# Patient Record
Sex: Female | Born: 1935 | Race: White | Hispanic: No | Smoking: Never smoker
Health system: Southern US, Community
[De-identification: ages and names within clinical notes are randomized; demographics above are authoritative.]

## PROBLEM LIST (undated history)

## (undated) DIAGNOSIS — I10 Essential (primary) hypertension: Secondary | ICD-10-CM

## (undated) DIAGNOSIS — E119 Type 2 diabetes mellitus without complications: Secondary | ICD-10-CM

## (undated) HISTORY — PX: LUMBAR DISC SURGERY: SHX700

## (undated) HISTORY — PX: ABDOMINAL HYSTERECTOMY: SHX81

## (undated) HISTORY — DX: Type 2 diabetes mellitus without complications: E11.9

## (undated) HISTORY — DX: Essential (primary) hypertension: I10

## (undated) HISTORY — PX: APPENDECTOMY: SHX54

---

## 2006-06-22 ENCOUNTER — Ambulatory Visit (HOSPITAL_COMMUNITY): Admission: RE | Admit: 2006-06-22 | Discharge: 2006-06-22 | Payer: Self-pay | Admitting: Otolaryngology

## 2006-09-27 ENCOUNTER — Ambulatory Visit: Payer: Self-pay | Admitting: Family Medicine

## 2006-09-29 ENCOUNTER — Encounter (INDEPENDENT_AMBULATORY_CARE_PROVIDER_SITE_OTHER): Payer: Self-pay | Admitting: Family Medicine

## 2006-09-29 LAB — CONVERTED CEMR LAB
AST: 20 units/L (ref 0–37)
Albumin: 3.7 g/dL (ref 3.5–5.2)
Alkaline Phosphatase: 113 units/L (ref 39–117)
BUN: 13 mg/dL (ref 6–23)
Basophils Relative: 1 % (ref 0–1)
Calcium: 9.3 mg/dL (ref 8.4–10.5)
Chloride: 101 meq/L (ref 96–112)
HDL: 59 mg/dL (ref >39)
LDL Cholesterol: 122 mg/dL — ABNORMAL HIGH (ref 0–99)
Lymphs Abs: 2.5 10*3/uL (ref 0.7–3.3)
MCHC: 33.8 g/dL (ref 30.0–36.0)
Monocytes Relative: 6 % (ref 3–11)
Neutro Abs: 5.4 10*3/uL (ref 1.7–7.7)
Neutrophils Relative %: 62 % (ref 43–77)
Platelets: 247 10*3/uL (ref 150–400)
Potassium: 4.5 meq/L (ref 3.5–5.3)
RBC: 5.21 M/uL — ABNORMAL HIGH (ref 3.87–5.11)
Sodium: 142 meq/L (ref 135–145)
TSH: 0.732 microintl units/mL (ref 0.350–5.50)
Total Protein: 6.8 g/dL (ref 6.0–8.3)
WBC: 8.8 10*3/uL (ref 4.0–10.5)

## 2006-10-02 ENCOUNTER — Encounter: Payer: Self-pay | Admitting: Family Medicine

## 2006-10-02 DIAGNOSIS — M545 Low back pain: Secondary | ICD-10-CM

## 2006-10-02 DIAGNOSIS — H269 Unspecified cataract: Secondary | ICD-10-CM

## 2006-10-02 DIAGNOSIS — M1990 Unspecified osteoarthritis, unspecified site: Secondary | ICD-10-CM | POA: Insufficient documentation

## 2006-10-02 DIAGNOSIS — K5900 Constipation, unspecified: Secondary | ICD-10-CM | POA: Insufficient documentation

## 2006-10-02 DIAGNOSIS — N751 Abscess of Bartholin's gland: Secondary | ICD-10-CM

## 2006-10-11 ENCOUNTER — Telehealth (INDEPENDENT_AMBULATORY_CARE_PROVIDER_SITE_OTHER): Payer: Self-pay | Admitting: Family Medicine

## 2006-10-11 ENCOUNTER — Ambulatory Visit: Payer: Self-pay | Admitting: Family Medicine

## 2006-10-11 DIAGNOSIS — E1165 Type 2 diabetes mellitus with hyperglycemia: Secondary | ICD-10-CM

## 2006-10-11 DIAGNOSIS — E119 Type 2 diabetes mellitus without complications: Secondary | ICD-10-CM | POA: Insufficient documentation

## 2006-10-11 DIAGNOSIS — E8881 Metabolic syndrome: Secondary | ICD-10-CM

## 2006-10-11 DIAGNOSIS — D751 Secondary polycythemia: Secondary | ICD-10-CM | POA: Insufficient documentation

## 2006-10-11 DIAGNOSIS — E785 Hyperlipidemia, unspecified: Secondary | ICD-10-CM | POA: Insufficient documentation

## 2006-10-11 DIAGNOSIS — N1832 Chronic kidney disease, stage 3b: Secondary | ICD-10-CM | POA: Insufficient documentation

## 2006-10-11 DIAGNOSIS — I10 Essential (primary) hypertension: Secondary | ICD-10-CM | POA: Insufficient documentation

## 2006-10-11 DIAGNOSIS — K432 Incisional hernia without obstruction or gangrene: Secondary | ICD-10-CM | POA: Insufficient documentation

## 2006-10-11 LAB — CONVERTED CEMR LAB
Cholesterol, target level: 200 mg/dL
Glucose, Bld: 253 mg/dL
Hgb A1c MFr Bld: 12.8 %
LDL Goal: 100 mg/dL

## 2006-10-25 ENCOUNTER — Ambulatory Visit: Payer: Self-pay | Admitting: Family Medicine

## 2006-10-29 LAB — CONVERTED CEMR LAB
BUN: 16 mg/dL (ref 6–23)
Basophils Absolute: 0 10*3/uL (ref 0.0–0.1)
Basophils Relative: 1 % (ref 0–1)
Chloride: 101 meq/L (ref 96–112)
Eosinophils Absolute: 0.1 10*3/uL (ref 0.0–0.7)
MCHC: 33.3 g/dL (ref 30.0–36.0)
MCV: 91.3 fL (ref 78.0–100.0)
Neutro Abs: 3.2 10*3/uL (ref 1.7–7.7)
Neutrophils Relative %: 51 % (ref 43–77)
Platelets: 246 10*3/uL (ref 150–400)
Potassium: 4.4 meq/L (ref 3.5–5.3)
RBC: 5.27 M/uL — ABNORMAL HIGH (ref 3.87–5.11)
RDW: 12.2 % (ref 11.5–14.0)

## 2006-11-22 ENCOUNTER — Ambulatory Visit: Payer: Self-pay | Admitting: Family Medicine

## 2006-11-23 ENCOUNTER — Encounter (INDEPENDENT_AMBULATORY_CARE_PROVIDER_SITE_OTHER): Payer: Self-pay | Admitting: Family Medicine

## 2006-11-27 LAB — CONVERTED CEMR LAB
AST: 17 units/L (ref 0–37)
Albumin: 4 g/dL (ref 3.5–5.2)
Alkaline Phosphatase: 89 units/L (ref 39–117)
BUN: 14 mg/dL (ref 6–23)
Calcium: 9.7 mg/dL (ref 8.4–10.5)
Chloride: 103 meq/L (ref 96–112)
Glucose, Bld: 123 mg/dL — ABNORMAL HIGH (ref 70–99)
Potassium: 4.2 meq/L (ref 3.5–5.3)
Sodium: 139 meq/L (ref 135–145)
Total Protein: 7.2 g/dL (ref 6.0–8.3)

## 2007-01-03 ENCOUNTER — Ambulatory Visit: Payer: Self-pay | Admitting: Family Medicine

## 2007-01-03 DIAGNOSIS — R9431 Abnormal electrocardiogram [ECG] [EKG]: Secondary | ICD-10-CM

## 2007-01-04 ENCOUNTER — Encounter (INDEPENDENT_AMBULATORY_CARE_PROVIDER_SITE_OTHER): Payer: Self-pay | Admitting: Family Medicine

## 2007-01-04 ENCOUNTER — Ambulatory Visit: Payer: Self-pay | Admitting: Cardiology

## 2007-01-08 ENCOUNTER — Encounter (INDEPENDENT_AMBULATORY_CARE_PROVIDER_SITE_OTHER): Payer: Self-pay | Admitting: Family Medicine

## 2007-01-09 LAB — CONVERTED CEMR LAB
AST: 15 units/L (ref 0–37)
Alkaline Phosphatase: 89 units/L (ref 39–117)
BUN: 20 mg/dL (ref 6–23)
Glucose, Bld: 132 mg/dL — ABNORMAL HIGH (ref 70–99)
HDL: 56 mg/dL (ref >39)
LDL Cholesterol: 91 mg/dL (ref 0–99)
Potassium: 5 meq/L (ref 3.5–5.3)
Sodium: 141 meq/L (ref 135–145)
Total Bilirubin: 0.6 mg/dL (ref 0.3–1.2)
Triglycerides: 116 mg/dL (ref <150)
VLDL: 23 mg/dL (ref 0–40)

## 2007-01-16 ENCOUNTER — Ambulatory Visit: Payer: Self-pay | Admitting: Cardiology

## 2007-01-17 ENCOUNTER — Encounter (HOSPITAL_COMMUNITY): Admission: RE | Admit: 2007-01-17 | Discharge: 2007-02-16 | Payer: Self-pay | Admitting: Cardiology

## 2007-01-17 ENCOUNTER — Ambulatory Visit: Payer: Self-pay | Admitting: Cardiology

## 2007-01-22 ENCOUNTER — Emergency Department (HOSPITAL_COMMUNITY): Admission: EM | Admit: 2007-01-22 | Discharge: 2007-01-22 | Payer: Self-pay | Admitting: Emergency Medicine

## 2007-02-07 ENCOUNTER — Encounter (INDEPENDENT_AMBULATORY_CARE_PROVIDER_SITE_OTHER): Payer: Self-pay | Admitting: Family Medicine

## 2007-02-09 ENCOUNTER — Encounter (INDEPENDENT_AMBULATORY_CARE_PROVIDER_SITE_OTHER): Payer: Self-pay | Admitting: Family Medicine

## 2007-02-14 ENCOUNTER — Ambulatory Visit: Payer: Self-pay | Admitting: Family Medicine

## 2007-02-14 LAB — CONVERTED CEMR LAB: Glucose, Bld: 109 mg/dL

## 2007-05-23 ENCOUNTER — Ambulatory Visit: Payer: Self-pay | Admitting: Family Medicine

## 2007-05-23 LAB — CONVERTED CEMR LAB
Blood in Urine, dipstick: NEGATIVE
Glucose, Urine, Semiquant: NEGATIVE
Ketones, urine, test strip: NEGATIVE
Nitrite: NEGATIVE
Protein, U semiquant: NEGATIVE

## 2007-05-24 LAB — CONVERTED CEMR LAB
Creatinine, Urine: 73.1 mg/dL
Microalb Creat Ratio: 5.5 mg/g (ref 0.0–30.0)
Microalb, Ur: 0.4 mg/dL (ref 0.00–1.89)

## 2007-05-28 ENCOUNTER — Encounter (INDEPENDENT_AMBULATORY_CARE_PROVIDER_SITE_OTHER): Payer: Self-pay | Admitting: Family Medicine

## 2007-06-06 ENCOUNTER — Ambulatory Visit: Payer: Self-pay | Admitting: Family Medicine

## 2007-06-11 ENCOUNTER — Encounter (INDEPENDENT_AMBULATORY_CARE_PROVIDER_SITE_OTHER): Payer: Self-pay | Admitting: Family Medicine

## 2007-06-11 ENCOUNTER — Telehealth (INDEPENDENT_AMBULATORY_CARE_PROVIDER_SITE_OTHER): Payer: Self-pay | Admitting: *Deleted

## 2007-06-15 ENCOUNTER — Encounter (INDEPENDENT_AMBULATORY_CARE_PROVIDER_SITE_OTHER): Payer: Self-pay | Admitting: Family Medicine

## 2007-07-05 ENCOUNTER — Encounter (INDEPENDENT_AMBULATORY_CARE_PROVIDER_SITE_OTHER): Payer: Self-pay | Admitting: Family Medicine

## 2007-08-22 ENCOUNTER — Ambulatory Visit: Payer: Self-pay | Admitting: Family Medicine

## 2007-08-22 LAB — CONVERTED CEMR LAB
Glucose, Bld: 96 mg/dL
Hgb A1c MFr Bld: 6.7 %

## 2007-09-04 ENCOUNTER — Encounter (INDEPENDENT_AMBULATORY_CARE_PROVIDER_SITE_OTHER): Payer: Self-pay | Admitting: Family Medicine

## 2007-09-05 ENCOUNTER — Telehealth (INDEPENDENT_AMBULATORY_CARE_PROVIDER_SITE_OTHER): Payer: Self-pay | Admitting: *Deleted

## 2007-09-05 LAB — CONVERTED CEMR LAB
ALT: 13 units/L (ref 0–35)
Alkaline Phosphatase: 82 units/L (ref 39–117)
Basophils Absolute: 0.1 10*3/uL (ref 0.0–0.1)
CO2: 25 meq/L (ref 19–32)
Cholesterol: 192 mg/dL (ref 0–200)
Eosinophils Relative: 3 % (ref 0–5)
HCT: 44.7 % (ref 36.0–46.0)
LDL Cholesterol: 99 mg/dL (ref 0–99)
Lymphocytes Relative: 30 % (ref 12–46)
Neutrophils Relative %: 60 % (ref 43–77)
Platelets: 273 10*3/uL (ref 150–400)
Potassium: 4.5 meq/L (ref 3.5–5.3)
RDW: 12.7 % (ref 11.5–15.5)
Sodium: 141 meq/L (ref 135–145)
Total Bilirubin: 0.5 mg/dL (ref 0.3–1.2)
Total Protein: 6.9 g/dL (ref 6.0–8.3)
VLDL: 27 mg/dL (ref 0–40)

## 2007-10-01 ENCOUNTER — Encounter (INDEPENDENT_AMBULATORY_CARE_PROVIDER_SITE_OTHER): Payer: Self-pay | Admitting: Family Medicine

## 2007-10-03 ENCOUNTER — Ambulatory Visit: Payer: Self-pay | Admitting: Family Medicine

## 2007-11-02 ENCOUNTER — Encounter (INDEPENDENT_AMBULATORY_CARE_PROVIDER_SITE_OTHER): Payer: Self-pay | Admitting: Family Medicine

## 2008-01-02 ENCOUNTER — Ambulatory Visit: Payer: Self-pay | Admitting: Family Medicine

## 2008-01-02 DIAGNOSIS — J309 Allergic rhinitis, unspecified: Secondary | ICD-10-CM | POA: Insufficient documentation

## 2008-01-02 LAB — CONVERTED CEMR LAB: Glucose, Bld: 6.7 mg/dL

## 2008-01-16 ENCOUNTER — Encounter (INDEPENDENT_AMBULATORY_CARE_PROVIDER_SITE_OTHER): Payer: Self-pay | Admitting: Family Medicine

## 2008-01-17 ENCOUNTER — Telehealth (INDEPENDENT_AMBULATORY_CARE_PROVIDER_SITE_OTHER): Payer: Self-pay | Admitting: *Deleted

## 2008-01-17 LAB — CONVERTED CEMR LAB
Albumin: 4.5 g/dL (ref 3.5–5.2)
CO2: 27 meq/L (ref 19–32)
Cholesterol: 194 mg/dL (ref 0–200)
Glucose, Bld: 135 mg/dL — ABNORMAL HIGH (ref 70–99)
LDL Cholesterol: 106 mg/dL — ABNORMAL HIGH (ref 0–99)
Lymphocytes Relative: 26 % (ref 12–46)
Lymphs Abs: 2 10*3/uL (ref 0.7–4.0)
Neutrophils Relative %: 64 % (ref 43–77)
Platelets: 262 10*3/uL (ref 150–400)
Potassium: 4.3 meq/L (ref 3.5–5.3)
Sodium: 144 meq/L (ref 135–145)
Total Protein: 7.1 g/dL (ref 6.0–8.3)
Triglycerides: 105 mg/dL (ref <150)
WBC: 7.7 10*3/uL (ref 4.0–10.5)

## 2008-03-04 ENCOUNTER — Encounter (INDEPENDENT_AMBULATORY_CARE_PROVIDER_SITE_OTHER): Payer: Self-pay | Admitting: Family Medicine

## 2008-04-02 ENCOUNTER — Ambulatory Visit: Payer: Self-pay | Admitting: Family Medicine

## 2008-04-02 DIAGNOSIS — IMO0001 Reserved for inherently not codable concepts without codable children: Secondary | ICD-10-CM

## 2008-04-03 LAB — CONVERTED CEMR LAB
ALT: 15 units/L (ref 0–35)
Albumin: 4.2 g/dL (ref 3.5–5.2)
Alkaline Phosphatase: 81 units/L (ref 39–117)
CO2: 20 meq/L (ref 19–32)
Glucose, Bld: 105 mg/dL — ABNORMAL HIGH (ref 70–99)
Potassium: 4.2 meq/L (ref 3.5–5.3)
Sodium: 138 meq/L (ref 135–145)
Total Protein: 7.2 g/dL (ref 6.0–8.3)

## 2008-04-25 ENCOUNTER — Ambulatory Visit: Payer: Self-pay | Admitting: Family Medicine

## 2008-06-06 ENCOUNTER — Ambulatory Visit: Payer: Self-pay | Admitting: Family Medicine

## 2008-06-09 LAB — CONVERTED CEMR LAB
ALT: 15 units/L (ref 0–35)
AST: 16 units/L (ref 0–37)
Albumin: 4 g/dL (ref 3.5–5.2)
Total Bilirubin: 0.5 mg/dL (ref 0.3–1.2)
Total Protein: 6.7 g/dL (ref 6.0–8.3)

## 2008-07-18 ENCOUNTER — Ambulatory Visit: Payer: Self-pay | Admitting: Family Medicine

## 2008-07-18 DIAGNOSIS — E669 Obesity, unspecified: Secondary | ICD-10-CM

## 2008-07-18 LAB — CONVERTED CEMR LAB: Blood Glucose, Fasting: 146 mg/dL

## 2008-07-21 LAB — CONVERTED CEMR LAB
ALT: 14 units/L (ref 0–35)
AST: 14 units/L (ref 0–37)
BUN: 17 mg/dL (ref 6–23)
Basophils Absolute: 0.1 10*3/uL (ref 0.0–0.1)
Basophils Relative: 1 % (ref 0–1)
Creatinine, Ser: 0.59 mg/dL (ref 0.40–1.20)
Creatinine, Urine: 141.8 mg/dL
Eosinophils Absolute: 0.2 10*3/uL (ref 0.0–0.7)
Eosinophils Relative: 3 % (ref 0–5)
HCT: 43.2 % (ref 36.0–46.0)
HDL: 65 mg/dL (ref >39)
Hemoglobin: 14.2 g/dL (ref 12.0–15.0)
MCHC: 32.9 g/dL (ref 30.0–36.0)
MCV: 90.8 fL (ref 78.0–100.0)
Monocytes Absolute: 0.7 10*3/uL (ref 0.1–1.0)
RDW: 12.9 % (ref 11.5–15.5)
TSH: 0.792 microintl units/mL (ref 0.350–4.50)
Total Bilirubin: 0.5 mg/dL (ref 0.3–1.2)
Total CHOL/HDL Ratio: 2.4
VLDL: 27 mg/dL (ref 0–40)

## 2008-10-17 ENCOUNTER — Ambulatory Visit: Payer: Self-pay | Admitting: Family Medicine

## 2008-10-17 LAB — CONVERTED CEMR LAB: Glucose, Bld: 135 mg/dL

## 2009-03-06 ENCOUNTER — Ambulatory Visit: Payer: Self-pay | Admitting: Family Medicine

## 2009-03-06 DIAGNOSIS — E119 Type 2 diabetes mellitus without complications: Secondary | ICD-10-CM

## 2009-03-06 LAB — CONVERTED CEMR LAB: Glucose, Bld: 142 mg/dL

## 2009-03-23 ENCOUNTER — Encounter (INDEPENDENT_AMBULATORY_CARE_PROVIDER_SITE_OTHER): Payer: Self-pay | Admitting: Family Medicine

## 2009-03-25 ENCOUNTER — Encounter (INDEPENDENT_AMBULATORY_CARE_PROVIDER_SITE_OTHER): Payer: Self-pay | Admitting: *Deleted

## 2009-03-25 LAB — CONVERTED CEMR LAB
ALT: 16 units/L (ref 0–35)
AST: 16 units/L (ref 0–37)
Albumin: 3.9 g/dL (ref 3.5–5.2)
BUN: 16 mg/dL (ref 6–23)
CO2: 26 meq/L (ref 19–32)
Calcium: 9.5 mg/dL (ref 8.4–10.5)
Chloride: 105 meq/L (ref 96–112)
Cholesterol: 188 mg/dL (ref 0–200)
Creatinine, Ser: 0.78 mg/dL (ref 0.40–1.20)
Potassium: 4.5 meq/L (ref 3.5–5.3)
Total CHOL/HDL Ratio: 3.2

## 2009-05-15 ENCOUNTER — Encounter (INDEPENDENT_AMBULATORY_CARE_PROVIDER_SITE_OTHER): Payer: Self-pay | Admitting: Family Medicine

## 2010-07-28 ENCOUNTER — Ambulatory Visit: Payer: Self-pay | Admitting: Cardiology

## 2010-07-28 ENCOUNTER — Observation Stay (HOSPITAL_COMMUNITY): Admission: EM | Admit: 2010-07-28 | Discharge: 2010-07-29 | Payer: Self-pay | Admitting: Emergency Medicine

## 2010-07-28 ENCOUNTER — Encounter (INDEPENDENT_AMBULATORY_CARE_PROVIDER_SITE_OTHER): Payer: Self-pay | Admitting: Internal Medicine

## 2010-09-25 ENCOUNTER — Encounter: Payer: Self-pay | Admitting: Otolaryngology

## 2010-09-25 ENCOUNTER — Encounter: Payer: Self-pay | Admitting: Internal Medicine

## 2010-11-16 LAB — URINALYSIS, ROUTINE W REFLEX MICROSCOPIC
Glucose, UA: NEGATIVE mg/dL
Hgb urine dipstick: NEGATIVE
Protein, ur: NEGATIVE mg/dL
pH: 6.5 (ref 5.0–8.0)

## 2010-11-16 LAB — COMPREHENSIVE METABOLIC PANEL
ALT: 16 U/L (ref 0–35)
Alkaline Phosphatase: 83 U/L (ref 39–117)
BUN: 14 mg/dL (ref 6–23)
CO2: 27 mEq/L (ref 19–32)
Chloride: 104 mEq/L (ref 96–112)
Glucose, Bld: 169 mg/dL — ABNORMAL HIGH (ref 70–99)
Potassium: 4 mEq/L (ref 3.5–5.1)
Sodium: 139 mEq/L (ref 135–145)
Total Bilirubin: 0.4 mg/dL (ref 0.3–1.2)
Total Protein: 6.5 g/dL (ref 6.0–8.3)

## 2010-11-16 LAB — URINE CULTURE

## 2010-11-16 LAB — DIFFERENTIAL
Basophils Absolute: 0.1 10*3/uL (ref 0.0–0.1)
Basophils Relative: 0 % (ref 0–1)
Basophils Relative: 1 % (ref 0–1)
Eosinophils Absolute: 0.1 10*3/uL (ref 0.0–0.7)
Lymphs Abs: 2 10*3/uL (ref 0.7–4.0)
Monocytes Relative: 5 % (ref 3–12)
Monocytes Relative: 6 % (ref 3–12)
Neutro Abs: 5.7 10*3/uL (ref 1.7–7.7)
Neutro Abs: 7.8 10*3/uL — ABNORMAL HIGH (ref 1.7–7.7)
Neutrophils Relative %: 69 % (ref 43–77)
Neutrophils Relative %: 81 % — ABNORMAL HIGH (ref 43–77)

## 2010-11-16 LAB — CBC
HCT: 37.3 % (ref 36.0–46.0)
Hemoglobin: 13 g/dL (ref 12.0–15.0)
Hemoglobin: 13.1 g/dL (ref 12.0–15.0)
MCHC: 34 g/dL (ref 30.0–36.0)
MCV: 85.4 fL (ref 78.0–100.0)
RBC: 4.37 MIL/uL (ref 3.87–5.11)
RBC: 4.53 MIL/uL (ref 3.87–5.11)
RDW: 13.2 % (ref 11.5–15.5)
WBC: 8.4 10*3/uL (ref 4.0–10.5)
WBC: 9.6 10*3/uL (ref 4.0–10.5)

## 2010-11-16 LAB — BASIC METABOLIC PANEL
CO2: 27 mEq/L (ref 19–32)
Glucose, Bld: 146 mg/dL — ABNORMAL HIGH (ref 70–99)
Potassium: 4.1 mEq/L (ref 3.5–5.1)
Sodium: 143 mEq/L (ref 135–145)

## 2010-11-16 LAB — POCT CARDIAC MARKERS: CKMB, poc: 1.5 ng/mL (ref 1.0–8.0)

## 2010-11-16 LAB — CARDIAC PANEL(CRET KIN+CKTOT+MB+TROPI)
CK, MB: 2.4 ng/mL (ref 0.3–4.0)
CK, MB: 3.4 ng/mL (ref 0.3–4.0)
Total CK: 89 U/L (ref 7–177)
Troponin I: 0.01 ng/mL (ref 0.00–0.06)

## 2010-11-16 LAB — TROPONIN I: Troponin I: 0.01 ng/mL (ref 0.00–0.06)

## 2010-11-16 LAB — GLUCOSE, CAPILLARY
Glucose-Capillary: 149 mg/dL — ABNORMAL HIGH (ref 70–99)
Glucose-Capillary: 166 mg/dL — ABNORMAL HIGH (ref 70–99)

## 2010-11-16 LAB — CK TOTAL AND CKMB (NOT AT ARMC): CK, MB: 2.7 ng/mL (ref 0.3–4.0)

## 2011-01-18 NOTE — Letter (Signed)
Jan 04, 2007    Weston Settle, M.D.  [address], [place]  [place], [place]   RE:  Whitney Watts, Whitney Watts  MRN:  [id number]  /  DOB:  1923/08/30   Dear Roderic Scarce,   Whitney Watts was evaluated in the office today in consultation, at your  request, for chest discomfort with multiple cardiovascular risk factors  and EKG abnormalities.  As you know, this nice woman has spent all her  life [place] and has been entirely healthy, but has received little in  the way of medical attention.  She recently sought evaluation in your  office for general medical care, and was found to have hypertension,  dyslipidemia and diabetes.  She has apparently done well with initial  medical therapy.  She was also noted to have EKG abnormalities.  On a  followup visit, she reported chest fullness whenever she took  amlodipine.  The dosage was decreased, but she continues to have some of  this vague discomfort.  There does not appear to be associated dyspnea,  diaphoresis nor nausea.  She has no problems with exertion.  She has  never previously been evaluated by a cardiologist nor undergone any  significant cardiac testing.   CURRENT MEDICATIONS INCLUDE:  1. Amlodipine 5 mg daily.  2. Pravastatin 20 mg daily.  3. Lisinopril/HCTZ 20/25 mg daily.  4. Aspirin 81 mg daily.  5. Metformin 500 mg b.i.d.   ALLERGIES:  She has no known drug allergies.  She does describe an  allergy to BEE STINGS.   PAST MEDICAL HISTORY:  Otherwise notable for a remote appendectomy,  hysterectomy approximately 1970, and lumbar spine surgery in 1970.  She  has had cataracts excised from both eyes.  Vaccinations are up to date.   SOCIAL HISTORY:  Married and lives locally.  Employed in the home.  Does  a considerable amount of walking.  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  Has two living adult children and one who died  due to neoplastic disease.   FAMILY HISTORY:  Otherwise notable  for her father dying at age 58, due  to renal cell carcinoma, and her mother at age 26, due to CHF.  She has  no first degree relatives with coronary disease.   REVIEW OF SYSTEMS:  Positive for occasional headaches since she started  her current medications, the need for corrective lenses, occasional  constipation, some DJD of her fingers.  All other systems reviewed and  are negative.   ON EXAM:  A very pleasant, sharp, older woman.  The weight is 187, blood  pressure 135/75, heart rate 90 and regular, respirations 16.  HEENT:  Grade 1 hypertensive changes on funduscopic exam.  NECK:  No jugular venous distention, normal carotid upstrokes, without  bruits.  ENDOCRINE:  No thyromegaly.  HEMATOPOIETIC:  No adenopathy.  SKIN:  No significant lesions.  CARDIAC:  Normal first and second heart sounds.  Split first heart  sound, versus fourth heart sound.  Minimal systolic murmur.  Normal PMI.  ABDOMEN:  Umbilical hernia.  Normal bowel sounds.  Soft and nontender  without organomegaly or masses.  EXTREMITIES:  One-half-plus ankle edema; distal pulses intact.  NEUROMUSCULAR:  Symmetric strength and tone.  Normal cranial nerves.  MUSCULOSKELETAL:  No joint deformities.   EKG:  Normal sinus rhythm, left anterior fascicular block, left atrial  enlargement, LVH with repolarization abnormality, probable prior septal  myocardial infarction.   IMPRESSION:  Despite  an absence of significant symptoms, Whitney Watts has  been found to have a number of medical issues.  Her EKG abnormalities  could all reflect LVH, but it is incumbent that we rule out prior  myocardial infarction.  With atypical chest pain, I would favor a stress  nuclear study over echocardiography.  We will schedule that test and  report the results to you as soon as it has been completed.   Otherwise, medical therapy appears optimal.  Hypertension is adequately  controlled.  She requires lipid-lowering therapy, due to the presence  of  diabetes and possible vascular disease.  Daily aspirin is prudent.   Thanks so much for sending this very nice woman to me.  I will keep you  apprised as her evaluation proceeds.    Sincerely,      Cristopher Estimable. Lattie Haw, MD, [facility]  Electronically Signed    RMR/MedQ  DD: 01/04/2007  DT: 01/04/2007  Job #: [id number]

## 2013-04-17 ENCOUNTER — Encounter: Payer: Self-pay | Admitting: Orthopedic Surgery

## 2013-04-17 ENCOUNTER — Ambulatory Visit (INDEPENDENT_AMBULATORY_CARE_PROVIDER_SITE_OTHER): Payer: Medicare Other

## 2013-04-17 ENCOUNTER — Ambulatory Visit (INDEPENDENT_AMBULATORY_CARE_PROVIDER_SITE_OTHER): Payer: Medicare Other | Admitting: Orthopedic Surgery

## 2013-04-17 VITALS — BP 138/80 | Ht 65.0 in | Wt 194.0 lb

## 2013-04-17 DIAGNOSIS — M25569 Pain in unspecified knee: Secondary | ICD-10-CM

## 2013-04-17 DIAGNOSIS — M179 Osteoarthritis of knee, unspecified: Secondary | ICD-10-CM

## 2013-04-17 DIAGNOSIS — M1710 Unilateral primary osteoarthritis, unspecified knee: Secondary | ICD-10-CM

## 2013-04-17 DIAGNOSIS — M25561 Pain in right knee: Secondary | ICD-10-CM

## 2013-04-17 NOTE — Progress Notes (Signed)
  Subjective:    Whitney Watts is a 79 y.o. female referred to [place] by Dr. Wende Neighbors with a 15 year history of pain in her right knee she's received multiple cortisone injections she basically has a typical arthritic symptoms of lateral knee pain aching throbbing occasional giving out symptoms stiffness swelling no recent trauma   The following portions of the patient's history were reviewed and updated as appropriate: allergies, current medications, past family history, past medical history, past social history, past surgical history and problem list.   Review of Systems Please review the scans documents this has been reviewed today by the physician.   Objective:    BP 138/80  Ht 5\' 5"  (1.651 m)  Wt 194 lb (87.998 kg)  BMI 32.28 kg/m2 General appearance is normal, the patient is alert and oriented x3 with normal mood and affect. She's walking with a cane doesn't have a significant limp her upper extremities show no alignment abnormalities no contracture subluxation atrophy or tremor  Is tenderness over the lateral joint line of the right knee she has maintained 125 of knee flexion her knee is stable but in valgus position strength is normal in terms of extension flexion. Scans intact pulses are good sensation is normal   X-ray shows valgus arthritis Assessment:    Right Osteoarthritis with valgus alignment    Plan:    She does not want a replacement although she looks to be in good enough shape to have one even at 79 years old. She wants an injection   Knee  Injection Procedure Note  Pre-operative Diagnosis: right knee oa  Post-operative Diagnosis: same  Indications: pain  Anesthesia: ethyl chloride   Procedure Details   Verbal consent was obtained for the procedure. Time out was completed.The joint was prepped with alcohol, followed by  Ethyl chloride spray and A 20 gauge needle was inserted into the knee via lateral approach; 60ml 1% lidocaine and 1 ml of depomedrol  was  then injected into the joint . The needle was removed and the area cleansed and dressed.  Complications:  None; patient tolerated the procedure well.

## 2013-04-17 NOTE — Patient Instructions (Addendum)

## 2016-12-16 DIAGNOSIS — E1165 Type 2 diabetes mellitus with hyperglycemia: Secondary | ICD-10-CM | POA: Diagnosis not present

## 2016-12-16 DIAGNOSIS — E782 Mixed hyperlipidemia: Secondary | ICD-10-CM | POA: Diagnosis not present

## 2016-12-23 DIAGNOSIS — Z6827 Body mass index (BMI) 27.0-27.9, adult: Secondary | ICD-10-CM | POA: Diagnosis not present

## 2016-12-23 DIAGNOSIS — E782 Mixed hyperlipidemia: Secondary | ICD-10-CM | POA: Diagnosis not present

## 2016-12-23 DIAGNOSIS — I10 Essential (primary) hypertension: Secondary | ICD-10-CM | POA: Diagnosis not present

## 2016-12-23 DIAGNOSIS — Z0000 Encounter for general adult medical examination without abnormal findings: Secondary | ICD-10-CM | POA: Diagnosis not present

## 2016-12-23 DIAGNOSIS — E1165 Type 2 diabetes mellitus with hyperglycemia: Secondary | ICD-10-CM | POA: Diagnosis not present

## 2017-07-11 DIAGNOSIS — E782 Mixed hyperlipidemia: Secondary | ICD-10-CM | POA: Diagnosis not present

## 2017-07-11 DIAGNOSIS — E1165 Type 2 diabetes mellitus with hyperglycemia: Secondary | ICD-10-CM | POA: Diagnosis not present

## 2017-07-11 DIAGNOSIS — I10 Essential (primary) hypertension: Secondary | ICD-10-CM | POA: Diagnosis not present

## 2017-07-13 DIAGNOSIS — E782 Mixed hyperlipidemia: Secondary | ICD-10-CM | POA: Diagnosis not present

## 2017-07-13 DIAGNOSIS — M25562 Pain in left knee: Secondary | ICD-10-CM | POA: Diagnosis not present

## 2017-07-13 DIAGNOSIS — R296 Repeated falls: Secondary | ICD-10-CM | POA: Diagnosis not present

## 2017-07-13 DIAGNOSIS — E1165 Type 2 diabetes mellitus with hyperglycemia: Secondary | ICD-10-CM | POA: Diagnosis not present

## 2017-07-13 DIAGNOSIS — Z23 Encounter for immunization: Secondary | ICD-10-CM | POA: Diagnosis not present

## 2017-07-13 DIAGNOSIS — I10 Essential (primary) hypertension: Secondary | ICD-10-CM | POA: Diagnosis not present

## 2017-08-21 ENCOUNTER — Other Ambulatory Visit: Payer: Self-pay | Admitting: Pharmacy Technician

## 2017-08-21 NOTE — Patient Outreach (Signed)
[facility]) Care Management  08/21/2017  Whitney Watts [birth year] [id number]  Incoming [employer] EMMI call in reference to medication adherence. HIPAA identifiers verified and verbal consent received. Patient states she is only on 3 medication's and she takes them daily as prescribed. She does not currently have any barriers that would affect her adherence.  Doreene Burke, [place] [phone]

## 2018-01-24 DIAGNOSIS — I10 Essential (primary) hypertension: Secondary | ICD-10-CM | POA: Diagnosis not present

## 2018-01-24 DIAGNOSIS — E782 Mixed hyperlipidemia: Secondary | ICD-10-CM | POA: Diagnosis not present

## 2018-01-26 DIAGNOSIS — Z6828 Body mass index (BMI) 28.0-28.9, adult: Secondary | ICD-10-CM | POA: Diagnosis not present

## 2018-01-26 DIAGNOSIS — E782 Mixed hyperlipidemia: Secondary | ICD-10-CM | POA: Diagnosis not present

## 2018-01-26 DIAGNOSIS — M25561 Pain in right knee: Secondary | ICD-10-CM | POA: Diagnosis not present

## 2018-01-26 DIAGNOSIS — E1165 Type 2 diabetes mellitus with hyperglycemia: Secondary | ICD-10-CM | POA: Diagnosis not present

## 2018-01-26 DIAGNOSIS — M545 Low back pain: Secondary | ICD-10-CM | POA: Diagnosis not present

## 2018-01-26 DIAGNOSIS — I10 Essential (primary) hypertension: Secondary | ICD-10-CM | POA: Diagnosis not present

## 2018-07-27 DIAGNOSIS — R002 Palpitations: Secondary | ICD-10-CM | POA: Diagnosis not present

## 2018-07-27 DIAGNOSIS — E1165 Type 2 diabetes mellitus with hyperglycemia: Secondary | ICD-10-CM | POA: Diagnosis not present

## 2018-07-27 DIAGNOSIS — I10 Essential (primary) hypertension: Secondary | ICD-10-CM | POA: Diagnosis not present

## 2018-07-27 DIAGNOSIS — E782 Mixed hyperlipidemia: Secondary | ICD-10-CM | POA: Diagnosis not present

## 2018-07-31 DIAGNOSIS — Z9181 History of falling: Secondary | ICD-10-CM | POA: Diagnosis not present

## 2018-07-31 DIAGNOSIS — R296 Repeated falls: Secondary | ICD-10-CM | POA: Diagnosis not present

## 2018-07-31 DIAGNOSIS — E1165 Type 2 diabetes mellitus with hyperglycemia: Secondary | ICD-10-CM | POA: Diagnosis not present

## 2018-07-31 DIAGNOSIS — Z974 Presence of external hearing-aid: Secondary | ICD-10-CM | POA: Diagnosis not present

## 2018-07-31 DIAGNOSIS — E782 Mixed hyperlipidemia: Secondary | ICD-10-CM | POA: Diagnosis not present

## 2018-07-31 DIAGNOSIS — Z0000 Encounter for general adult medical examination without abnormal findings: Secondary | ICD-10-CM | POA: Diagnosis not present

## 2018-07-31 DIAGNOSIS — M1990 Unspecified osteoarthritis, unspecified site: Secondary | ICD-10-CM | POA: Diagnosis not present

## 2018-07-31 DIAGNOSIS — I10 Essential (primary) hypertension: Secondary | ICD-10-CM | POA: Diagnosis not present

## 2018-07-31 DIAGNOSIS — M25562 Pain in left knee: Secondary | ICD-10-CM | POA: Diagnosis not present

## 2019-01-21 DIAGNOSIS — Z0000 Encounter for general adult medical examination without abnormal findings: Secondary | ICD-10-CM | POA: Diagnosis not present

## 2019-01-31 DIAGNOSIS — R002 Palpitations: Secondary | ICD-10-CM | POA: Diagnosis not present

## 2019-01-31 DIAGNOSIS — E782 Mixed hyperlipidemia: Secondary | ICD-10-CM | POA: Diagnosis not present

## 2019-01-31 DIAGNOSIS — E1165 Type 2 diabetes mellitus with hyperglycemia: Secondary | ICD-10-CM | POA: Diagnosis not present

## 2019-01-31 DIAGNOSIS — I10 Essential (primary) hypertension: Secondary | ICD-10-CM | POA: Diagnosis not present

## 2019-02-04 DIAGNOSIS — E1165 Type 2 diabetes mellitus with hyperglycemia: Secondary | ICD-10-CM | POA: Diagnosis not present

## 2019-02-04 DIAGNOSIS — R002 Palpitations: Secondary | ICD-10-CM | POA: Diagnosis not present

## 2019-02-04 DIAGNOSIS — E782 Mixed hyperlipidemia: Secondary | ICD-10-CM | POA: Diagnosis not present

## 2019-02-04 DIAGNOSIS — I10 Essential (primary) hypertension: Secondary | ICD-10-CM | POA: Diagnosis not present

## 2019-03-07 DIAGNOSIS — I10 Essential (primary) hypertension: Secondary | ICD-10-CM | POA: Diagnosis not present

## 2019-03-07 DIAGNOSIS — E1165 Type 2 diabetes mellitus with hyperglycemia: Secondary | ICD-10-CM | POA: Diagnosis not present

## 2019-03-07 DIAGNOSIS — E782 Mixed hyperlipidemia: Secondary | ICD-10-CM | POA: Diagnosis not present

## 2019-03-07 DIAGNOSIS — R002 Palpitations: Secondary | ICD-10-CM | POA: Diagnosis not present

## 2019-04-08 ENCOUNTER — Other Ambulatory Visit: Payer: Self-pay

## 2019-04-18 DIAGNOSIS — E782 Mixed hyperlipidemia: Secondary | ICD-10-CM | POA: Diagnosis not present

## 2019-04-18 DIAGNOSIS — I10 Essential (primary) hypertension: Secondary | ICD-10-CM | POA: Diagnosis not present

## 2019-04-18 DIAGNOSIS — R002 Palpitations: Secondary | ICD-10-CM | POA: Diagnosis not present

## 2019-04-18 DIAGNOSIS — E1165 Type 2 diabetes mellitus with hyperglycemia: Secondary | ICD-10-CM | POA: Diagnosis not present

## 2019-05-17 DIAGNOSIS — I10 Essential (primary) hypertension: Secondary | ICD-10-CM | POA: Diagnosis not present

## 2019-05-17 DIAGNOSIS — E782 Mixed hyperlipidemia: Secondary | ICD-10-CM | POA: Diagnosis not present

## 2019-05-17 DIAGNOSIS — E1165 Type 2 diabetes mellitus with hyperglycemia: Secondary | ICD-10-CM | POA: Diagnosis not present

## 2019-05-21 DIAGNOSIS — E782 Mixed hyperlipidemia: Secondary | ICD-10-CM | POA: Diagnosis not present

## 2019-05-21 DIAGNOSIS — R002 Palpitations: Secondary | ICD-10-CM | POA: Diagnosis not present

## 2019-05-21 DIAGNOSIS — E1165 Type 2 diabetes mellitus with hyperglycemia: Secondary | ICD-10-CM | POA: Diagnosis not present

## 2019-05-21 DIAGNOSIS — I10 Essential (primary) hypertension: Secondary | ICD-10-CM | POA: Diagnosis not present

## 2019-05-22 DIAGNOSIS — E1165 Type 2 diabetes mellitus with hyperglycemia: Secondary | ICD-10-CM | POA: Diagnosis not present

## 2019-05-22 DIAGNOSIS — R296 Repeated falls: Secondary | ICD-10-CM | POA: Diagnosis not present

## 2019-05-22 DIAGNOSIS — M479 Spondylosis, unspecified: Secondary | ICD-10-CM | POA: Diagnosis not present

## 2019-05-22 DIAGNOSIS — I10 Essential (primary) hypertension: Secondary | ICD-10-CM | POA: Diagnosis not present

## 2019-05-22 DIAGNOSIS — M25561 Pain in right knee: Secondary | ICD-10-CM | POA: Diagnosis not present

## 2019-05-22 DIAGNOSIS — E782 Mixed hyperlipidemia: Secondary | ICD-10-CM | POA: Diagnosis not present

## 2019-05-22 DIAGNOSIS — Z9181 History of falling: Secondary | ICD-10-CM | POA: Diagnosis not present

## 2019-05-22 DIAGNOSIS — M1711 Unilateral primary osteoarthritis, right knee: Secondary | ICD-10-CM | POA: Diagnosis not present

## 2019-06-20 DIAGNOSIS — E1165 Type 2 diabetes mellitus with hyperglycemia: Secondary | ICD-10-CM | POA: Diagnosis not present

## 2019-06-20 DIAGNOSIS — I10 Essential (primary) hypertension: Secondary | ICD-10-CM | POA: Diagnosis not present

## 2019-06-20 DIAGNOSIS — E782 Mixed hyperlipidemia: Secondary | ICD-10-CM | POA: Diagnosis not present

## 2019-06-20 DIAGNOSIS — M479 Spondylosis, unspecified: Secondary | ICD-10-CM | POA: Diagnosis not present

## 2019-06-20 DIAGNOSIS — M1711 Unilateral primary osteoarthritis, right knee: Secondary | ICD-10-CM | POA: Diagnosis not present

## 2019-07-26 DIAGNOSIS — R002 Palpitations: Secondary | ICD-10-CM | POA: Diagnosis not present

## 2019-07-26 DIAGNOSIS — E782 Mixed hyperlipidemia: Secondary | ICD-10-CM | POA: Diagnosis not present

## 2019-07-26 DIAGNOSIS — E1165 Type 2 diabetes mellitus with hyperglycemia: Secondary | ICD-10-CM | POA: Diagnosis not present

## 2019-07-26 DIAGNOSIS — I10 Essential (primary) hypertension: Secondary | ICD-10-CM | POA: Diagnosis not present

## 2019-08-22 DIAGNOSIS — R002 Palpitations: Secondary | ICD-10-CM | POA: Diagnosis not present

## 2019-08-22 DIAGNOSIS — I10 Essential (primary) hypertension: Secondary | ICD-10-CM | POA: Diagnosis not present

## 2019-08-22 DIAGNOSIS — E1165 Type 2 diabetes mellitus with hyperglycemia: Secondary | ICD-10-CM | POA: Diagnosis not present

## 2019-08-22 DIAGNOSIS — E782 Mixed hyperlipidemia: Secondary | ICD-10-CM | POA: Diagnosis not present

## 2019-09-25 DIAGNOSIS — I10 Essential (primary) hypertension: Secondary | ICD-10-CM | POA: Diagnosis not present

## 2019-09-25 DIAGNOSIS — E1165 Type 2 diabetes mellitus with hyperglycemia: Secondary | ICD-10-CM | POA: Diagnosis not present

## 2019-09-25 DIAGNOSIS — E7849 Other hyperlipidemia: Secondary | ICD-10-CM | POA: Diagnosis not present

## 2019-09-25 DIAGNOSIS — R002 Palpitations: Secondary | ICD-10-CM | POA: Diagnosis not present

## 2019-10-24 DIAGNOSIS — I10 Essential (primary) hypertension: Secondary | ICD-10-CM | POA: Diagnosis not present

## 2019-10-24 DIAGNOSIS — E1165 Type 2 diabetes mellitus with hyperglycemia: Secondary | ICD-10-CM | POA: Diagnosis not present

## 2019-10-24 DIAGNOSIS — E7849 Other hyperlipidemia: Secondary | ICD-10-CM | POA: Diagnosis not present

## 2019-10-24 DIAGNOSIS — R002 Palpitations: Secondary | ICD-10-CM | POA: Diagnosis not present

## 2019-10-24 DIAGNOSIS — E782 Mixed hyperlipidemia: Secondary | ICD-10-CM | POA: Diagnosis not present

## 2020-01-15 DIAGNOSIS — R002 Palpitations: Secondary | ICD-10-CM | POA: Diagnosis not present

## 2020-01-15 DIAGNOSIS — I10 Essential (primary) hypertension: Secondary | ICD-10-CM | POA: Diagnosis not present

## 2020-01-15 DIAGNOSIS — E782 Mixed hyperlipidemia: Secondary | ICD-10-CM | POA: Diagnosis not present

## 2020-01-15 DIAGNOSIS — E7849 Other hyperlipidemia: Secondary | ICD-10-CM | POA: Diagnosis not present

## 2020-01-15 DIAGNOSIS — E1165 Type 2 diabetes mellitus with hyperglycemia: Secondary | ICD-10-CM | POA: Diagnosis not present

## 2020-01-28 DIAGNOSIS — M25562 Pain in left knee: Secondary | ICD-10-CM | POA: Diagnosis not present

## 2020-01-28 DIAGNOSIS — E782 Mixed hyperlipidemia: Secondary | ICD-10-CM | POA: Diagnosis not present

## 2020-01-28 DIAGNOSIS — M545 Low back pain: Secondary | ICD-10-CM | POA: Diagnosis not present

## 2020-01-28 DIAGNOSIS — R296 Repeated falls: Secondary | ICD-10-CM | POA: Diagnosis not present

## 2020-01-28 DIAGNOSIS — R002 Palpitations: Secondary | ICD-10-CM | POA: Diagnosis not present

## 2020-01-28 DIAGNOSIS — I10 Essential (primary) hypertension: Secondary | ICD-10-CM | POA: Diagnosis not present

## 2020-01-28 DIAGNOSIS — M479 Spondylosis, unspecified: Secondary | ICD-10-CM | POA: Diagnosis not present

## 2020-01-28 DIAGNOSIS — M1990 Unspecified osteoarthritis, unspecified site: Secondary | ICD-10-CM | POA: Diagnosis not present

## 2020-01-28 DIAGNOSIS — M1711 Unilateral primary osteoarthritis, right knee: Secondary | ICD-10-CM | POA: Diagnosis not present

## 2020-01-28 DIAGNOSIS — M25561 Pain in right knee: Secondary | ICD-10-CM | POA: Diagnosis not present

## 2020-01-28 DIAGNOSIS — E7849 Other hyperlipidemia: Secondary | ICD-10-CM | POA: Diagnosis not present

## 2020-01-28 DIAGNOSIS — E1165 Type 2 diabetes mellitus with hyperglycemia: Secondary | ICD-10-CM | POA: Diagnosis not present

## 2020-01-30 DIAGNOSIS — L02213 Cutaneous abscess of chest wall: Secondary | ICD-10-CM | POA: Diagnosis not present

## 2020-02-04 DIAGNOSIS — L02213 Cutaneous abscess of chest wall: Secondary | ICD-10-CM | POA: Diagnosis not present

## 2020-02-13 DIAGNOSIS — M479 Spondylosis, unspecified: Secondary | ICD-10-CM | POA: Diagnosis not present

## 2020-02-13 DIAGNOSIS — M25561 Pain in right knee: Secondary | ICD-10-CM | POA: Diagnosis not present

## 2020-02-13 DIAGNOSIS — M1711 Unilateral primary osteoarthritis, right knee: Secondary | ICD-10-CM | POA: Diagnosis not present

## 2020-02-13 DIAGNOSIS — I10 Essential (primary) hypertension: Secondary | ICD-10-CM | POA: Diagnosis not present

## 2020-02-13 DIAGNOSIS — E782 Mixed hyperlipidemia: Secondary | ICD-10-CM | POA: Diagnosis not present

## 2020-02-13 DIAGNOSIS — E1165 Type 2 diabetes mellitus with hyperglycemia: Secondary | ICD-10-CM | POA: Diagnosis not present

## 2020-02-18 ENCOUNTER — Ambulatory Visit: Payer: PPO | Admitting: General Surgery

## 2020-04-09 DIAGNOSIS — E782 Mixed hyperlipidemia: Secondary | ICD-10-CM | POA: Diagnosis not present

## 2020-04-09 DIAGNOSIS — I10 Essential (primary) hypertension: Secondary | ICD-10-CM | POA: Diagnosis not present

## 2020-04-09 DIAGNOSIS — M25561 Pain in right knee: Secondary | ICD-10-CM | POA: Diagnosis not present

## 2020-04-09 DIAGNOSIS — E1165 Type 2 diabetes mellitus with hyperglycemia: Secondary | ICD-10-CM | POA: Diagnosis not present

## 2020-04-09 DIAGNOSIS — M1711 Unilateral primary osteoarthritis, right knee: Secondary | ICD-10-CM | POA: Diagnosis not present

## 2020-04-09 DIAGNOSIS — M479 Spondylosis, unspecified: Secondary | ICD-10-CM | POA: Diagnosis not present

## 2020-05-19 DIAGNOSIS — M25561 Pain in right knee: Secondary | ICD-10-CM | POA: Diagnosis not present

## 2020-05-19 DIAGNOSIS — E1165 Type 2 diabetes mellitus with hyperglycemia: Secondary | ICD-10-CM | POA: Diagnosis not present

## 2020-05-19 DIAGNOSIS — M479 Spondylosis, unspecified: Secondary | ICD-10-CM | POA: Diagnosis not present

## 2020-05-19 DIAGNOSIS — M1711 Unilateral primary osteoarthritis, right knee: Secondary | ICD-10-CM | POA: Diagnosis not present

## 2020-05-19 DIAGNOSIS — E782 Mixed hyperlipidemia: Secondary | ICD-10-CM | POA: Diagnosis not present

## 2020-05-19 DIAGNOSIS — I10 Essential (primary) hypertension: Secondary | ICD-10-CM | POA: Diagnosis not present

## 2020-06-16 DIAGNOSIS — M1711 Unilateral primary osteoarthritis, right knee: Secondary | ICD-10-CM | POA: Diagnosis not present

## 2020-06-16 DIAGNOSIS — I10 Essential (primary) hypertension: Secondary | ICD-10-CM | POA: Diagnosis not present

## 2020-06-16 DIAGNOSIS — E782 Mixed hyperlipidemia: Secondary | ICD-10-CM | POA: Diagnosis not present

## 2020-06-16 DIAGNOSIS — E1165 Type 2 diabetes mellitus with hyperglycemia: Secondary | ICD-10-CM | POA: Diagnosis not present

## 2020-06-16 DIAGNOSIS — M479 Spondylosis, unspecified: Secondary | ICD-10-CM | POA: Diagnosis not present

## 2020-06-16 DIAGNOSIS — M25561 Pain in right knee: Secondary | ICD-10-CM | POA: Diagnosis not present

## 2020-06-17 ENCOUNTER — Emergency Department (HOSPITAL_COMMUNITY): Payer: PPO

## 2020-06-17 ENCOUNTER — Encounter (HOSPITAL_COMMUNITY): Payer: Self-pay | Admitting: Emergency Medicine

## 2020-06-17 ENCOUNTER — Inpatient Hospital Stay (HOSPITAL_COMMUNITY)
Admission: EM | Admit: 2020-06-17 | Discharge: 2020-06-22 | DRG: 193 | Disposition: A | Discharge status: Skilled Nursing Facility | Payer: PPO | Attending: Internal Medicine | Admitting: Internal Medicine

## 2020-06-17 ENCOUNTER — Other Ambulatory Visit: Payer: Self-pay

## 2020-06-17 DIAGNOSIS — Z20822 Contact with and (suspected) exposure to covid-19: Secondary | ICD-10-CM | POA: Diagnosis present

## 2020-06-17 DIAGNOSIS — R5381 Other malaise: Secondary | ICD-10-CM | POA: Diagnosis not present

## 2020-06-17 DIAGNOSIS — R911 Solitary pulmonary nodule: Secondary | ICD-10-CM | POA: Diagnosis not present

## 2020-06-17 DIAGNOSIS — E119 Type 2 diabetes mellitus without complications: Secondary | ICD-10-CM

## 2020-06-17 DIAGNOSIS — I1310 Hypertensive heart and chronic kidney disease without heart failure, with stage 1 through stage 4 chronic kidney disease, or unspecified chronic kidney disease: Secondary | ICD-10-CM | POA: Diagnosis present

## 2020-06-17 DIAGNOSIS — E1122 Type 2 diabetes mellitus with diabetic chronic kidney disease: Secondary | ICD-10-CM | POA: Diagnosis present

## 2020-06-17 DIAGNOSIS — Y929 Unspecified place or not applicable: Secondary | ICD-10-CM

## 2020-06-17 DIAGNOSIS — M6281 Muscle weakness (generalized): Secondary | ICD-10-CM | POA: Diagnosis not present

## 2020-06-17 DIAGNOSIS — E041 Nontoxic single thyroid nodule: Secondary | ICD-10-CM

## 2020-06-17 DIAGNOSIS — M8580 Other specified disorders of bone density and structure, unspecified site: Secondary | ICD-10-CM | POA: Diagnosis present

## 2020-06-17 DIAGNOSIS — R279 Unspecified lack of coordination: Secondary | ICD-10-CM | POA: Diagnosis not present

## 2020-06-17 DIAGNOSIS — E441 Mild protein-calorie malnutrition: Secondary | ICD-10-CM | POA: Diagnosis present

## 2020-06-17 DIAGNOSIS — M25461 Effusion, right knee: Secondary | ICD-10-CM | POA: Diagnosis not present

## 2020-06-17 DIAGNOSIS — Z9889 Other specified postprocedural states: Secondary | ICD-10-CM

## 2020-06-17 DIAGNOSIS — E876 Hypokalemia: Secondary | ICD-10-CM | POA: Diagnosis present

## 2020-06-17 DIAGNOSIS — Z888 Allergy status to other drugs, medicaments and biological substances status: Secondary | ICD-10-CM | POA: Diagnosis not present

## 2020-06-17 DIAGNOSIS — R531 Weakness: Secondary | ICD-10-CM

## 2020-06-17 DIAGNOSIS — N1832 Chronic kidney disease, stage 3b: Secondary | ICD-10-CM | POA: Diagnosis present

## 2020-06-17 DIAGNOSIS — E860 Dehydration: Secondary | ICD-10-CM | POA: Diagnosis not present

## 2020-06-17 DIAGNOSIS — W19XXXA Unspecified fall, initial encounter: Secondary | ICD-10-CM | POA: Diagnosis not present

## 2020-06-17 DIAGNOSIS — J189 Pneumonia, unspecified organism: Principal | ICD-10-CM

## 2020-06-17 DIAGNOSIS — E8809 Other disorders of plasma-protein metabolism, not elsewhere classified: Secondary | ICD-10-CM | POA: Diagnosis present

## 2020-06-17 DIAGNOSIS — Z833 Family history of diabetes mellitus: Secondary | ICD-10-CM

## 2020-06-17 DIAGNOSIS — Z91030 Bee allergy status: Secondary | ICD-10-CM | POA: Diagnosis not present

## 2020-06-17 DIAGNOSIS — R2681 Unsteadiness on feet: Secondary | ICD-10-CM | POA: Diagnosis not present

## 2020-06-17 DIAGNOSIS — Z043 Encounter for examination and observation following other accident: Secondary | ICD-10-CM | POA: Diagnosis not present

## 2020-06-17 DIAGNOSIS — M25551 Pain in right hip: Secondary | ICD-10-CM | POA: Diagnosis present

## 2020-06-17 DIAGNOSIS — Z743 Need for continuous supervision: Secondary | ICD-10-CM | POA: Diagnosis not present

## 2020-06-17 DIAGNOSIS — I10 Essential (primary) hypertension: Secondary | ICD-10-CM | POA: Diagnosis not present

## 2020-06-17 DIAGNOSIS — R278 Other lack of coordination: Secondary | ICD-10-CM | POA: Diagnosis not present

## 2020-06-17 DIAGNOSIS — R918 Other nonspecific abnormal finding of lung field: Secondary | ICD-10-CM | POA: Diagnosis not present

## 2020-06-17 DIAGNOSIS — J918 Pleural effusion in other conditions classified elsewhere: Secondary | ICD-10-CM | POA: Diagnosis present

## 2020-06-17 DIAGNOSIS — R0902 Hypoxemia: Secondary | ICD-10-CM | POA: Diagnosis not present

## 2020-06-17 DIAGNOSIS — J90 Pleural effusion, not elsewhere classified: Secondary | ICD-10-CM

## 2020-06-17 DIAGNOSIS — R000 Tachycardia, unspecified: Secondary | ICD-10-CM | POA: Diagnosis not present

## 2020-06-17 DIAGNOSIS — Z66 Do not resuscitate: Secondary | ICD-10-CM | POA: Diagnosis present

## 2020-06-17 DIAGNOSIS — W1830XA Fall on same level, unspecified, initial encounter: Secondary | ICD-10-CM | POA: Diagnosis present

## 2020-06-17 DIAGNOSIS — R0689 Other abnormalities of breathing: Secondary | ICD-10-CM | POA: Diagnosis not present

## 2020-06-17 DIAGNOSIS — J9601 Acute respiratory failure with hypoxia: Secondary | ICD-10-CM | POA: Diagnosis present

## 2020-06-17 DIAGNOSIS — R519 Headache, unspecified: Secondary | ICD-10-CM | POA: Diagnosis not present

## 2020-06-17 DIAGNOSIS — R091 Pleurisy: Secondary | ICD-10-CM | POA: Diagnosis not present

## 2020-06-17 DIAGNOSIS — E109 Type 1 diabetes mellitus without complications: Secondary | ICD-10-CM | POA: Diagnosis not present

## 2020-06-17 LAB — COMPREHENSIVE METABOLIC PANEL
ALT: 12 U/L (ref 0–44)
AST: 13 U/L — ABNORMAL LOW (ref 15–41)
Albumin: 2.7 g/dL — ABNORMAL LOW (ref 3.5–5.0)
Alkaline Phosphatase: 121 U/L (ref 38–126)
Anion gap: 12 (ref 5–15)
BUN: 13 mg/dL (ref 8–23)
CO2: 22 mmol/L (ref 22–32)
Calcium: 8.7 mg/dL — ABNORMAL LOW (ref 8.9–10.3)
Chloride: 98 mmol/L (ref 98–111)
Creatinine, Ser: 0.47 mg/dL (ref 0.44–1.00)
GFR, Estimated: >60 mL/min (ref >60)
Glucose, Bld: 182 mg/dL — ABNORMAL HIGH (ref 70–99)
Potassium: 3.3 mmol/L — ABNORMAL LOW (ref 3.5–5.1)
Sodium: 132 mmol/L — ABNORMAL LOW (ref 135–145)
Total Bilirubin: 0.8 mg/dL (ref 0.3–1.2)
Total Protein: 6.7 g/dL (ref 6.5–8.1)

## 2020-06-17 LAB — URINALYSIS, ROUTINE W REFLEX MICROSCOPIC
Bacteria, UA: NONE SEEN
Bilirubin Urine: NEGATIVE
Glucose, UA: NEGATIVE mg/dL
Hgb urine dipstick: NEGATIVE
Ketones, ur: 20 mg/dL — AB
Nitrite: NEGATIVE
Protein, ur: NEGATIVE mg/dL
Specific Gravity, Urine: 1.019 (ref 1.005–1.030)
pH: 6 (ref 5.0–8.0)

## 2020-06-17 LAB — CBC WITH DIFFERENTIAL/PLATELET
Abs Immature Granulocytes: 0.13 10*3/uL — ABNORMAL HIGH (ref 0.00–0.07)
Basophils Absolute: 0 10*3/uL (ref 0.0–0.1)
Basophils Relative: 0 %
Eosinophils Absolute: 1.1 10*3/uL — ABNORMAL HIGH (ref 0.0–0.5)
Eosinophils Relative: 6 %
HCT: 34.4 % — ABNORMAL LOW (ref 36.0–46.0)
Hemoglobin: 11.7 g/dL — ABNORMAL LOW (ref 12.0–15.0)
Immature Granulocytes: 1 %
Lymphocytes Relative: 4 %
Lymphs Abs: 0.6 10*3/uL — ABNORMAL LOW (ref 0.7–4.0)
MCH: 30 pg (ref 26.0–34.0)
MCHC: 34 g/dL (ref 30.0–36.0)
MCV: 88.2 fL (ref 80.0–100.0)
Monocytes Absolute: 1 10*3/uL (ref 0.1–1.0)
Monocytes Relative: 6 %
Neutro Abs: 13.8 10*3/uL — ABNORMAL HIGH (ref 1.7–7.7)
Neutrophils Relative %: 83 %
Platelets: 448 10*3/uL — ABNORMAL HIGH (ref 150–400)
RBC: 3.9 MIL/uL (ref 3.87–5.11)
RDW: 12.6 % (ref 11.5–15.5)
WBC: 16.6 10*3/uL — ABNORMAL HIGH (ref 4.0–10.5)
nRBC: 0 % (ref 0.0–0.2)

## 2020-06-17 LAB — RESPIRATORY PANEL BY RT PCR (FLU A&B, COVID)
Influenza A by PCR: NEGATIVE
Influenza B by PCR: NEGATIVE
SARS Coronavirus 2 by RT PCR: NEGATIVE

## 2020-06-17 MED ORDER — IOHEXOL 300 MG/ML  SOLN
75.0000 mL | Freq: Once | INTRAMUSCULAR | Status: AC | PRN
  Administered 2020-06-17: 75 mL via INTRAVENOUS

## 2020-06-17 MED ORDER — POTASSIUM CHLORIDE CRYS ER 20 MEQ PO TBCR
20.0000 meq | EXTENDED_RELEASE_TABLET | Freq: Once | ORAL | Status: AC
  Administered 2020-06-17: 20 meq via ORAL
  Filled 2020-06-17: qty 1

## 2020-06-17 NOTE — ED Provider Notes (Signed)
[facility] EMERGENCY DEPARTMENT Provider Note   CSN: [id number] Arrival date & time: 06/17/20  1916     History Chief Complaint  Patient presents with  . Fall    Whitney Watts is a 86 y.o. female.  HPI   Patient is a 86 year old female with a medical history as noted below.  She states that she presents to the emergency department due to a fall that occurred this afternoon.  She states she was ambulating with a walker and "her knees gave out" and she landed on her knees and then was able to lie on the floor.  She states that she did not have the strength to get back up onto her feet, and after receiving a couple of missed calls from her daughter, her daughter then sent a neighbor over to check on her.  She was found on the floor and EMS was contacted.  Patient denies any acute head or neck pain.  She states that about 3 days ago she began experiencing more fatigue and weakness.  Also notes some mild congestion and rhinorrhea with an intermittent dry cough.  She states that she is a diabetic and has had a decreased appetite for the past few days, so because of this has had little p.o. intake.  Denies any chest pain, shortness of breath, nausea, vomiting, diarrhea.  She has been vaccinated for COVID-19.     Past Medical History:  Diagnosis Date  . DM (diabetes mellitus) ([place])   . HTN (hypertension)     Patient Active Problem List   Diagnosis Date Noted  . OA (osteoarthritis) of knee 04/17/2013  . Right knee pain 04/17/2013  . DIABETES MELLITUS, TYPE II, CONTROLLED 03/06/2009  . OBESITY 07/18/2008  . MYALGIA 04/02/2008  . ALLERGIC RHINITIS 01/02/2008  . ABNORMAL ELECTROCARDIOGRAM 01/03/2007  . DIABETES MELLITUS, TYPE II, UNCONTROLLED 10/11/2006  . HYPERLIPIDEMIA 10/11/2006  . DYSMETABOLIC SYNDROME 19/41/7408  . POLYCYTHEMIA 10/11/2006  . ESSENTIAL HYPERTENSION 10/11/2006  . ABDOMINAL INCISIONAL HERNIA 10/11/2006  . CATARACT NOS 10/02/2006  . CONSTIPATION NOS 10/02/2006  .  ABSCESS, [place] 10/02/2006  . OSTEOARTHRITIS 10/02/2006  . LOW BACK PAIN 10/02/2006    Past Surgical History:  Procedure Laterality Date  . ABDOMINAL HYSTERECTOMY    . APPENDECTOMY    . LUMBAR DISC SURGERY       OB History   No obstetric history on file.     Family History  Problem Relation Age of Onset  . Diabetes Other   . Cancer Other   . Arthritis Other     Social History   Tobacco Use  . Smoking status: Unknown If Ever Smoked  . Smokeless tobacco: Never Used  Substance Use Topics  . Alcohol use: No  . Drug use: No    Home Medications Prior to Admission medications   Medication Sig Start Date End Date Taking? Authorizing Provider  amLODipine (NORVASC) 10 MG tablet Take 10 mg by mouth daily.    [provider]  aspirin 81 MG tablet Take 81 mg by mouth daily.    [provider]  losartan-hydrochlorothiazide (HYZAAR) 100-12.5 MG per tablet Take 1 tablet by mouth daily.    [provider]    Allergies    Pravastatin sodium, Rosuvastatin, and Bee venom  Review of Systems   Review of Systems  All other systems reviewed and are negative. Ten systems reviewed and are negative for acute change, except as noted in the HPI.    Physical Exam Updated  Vital Signs BP (!) 151/75   Pulse (!) 121   Temp 99.4 F (37.4 C) (Oral)   Resp 20   Ht 5\' 6"  (1.676 m)   Wt 74.8 kg   SpO2 100%   BMI 26.63 kg/m   Physical Exam Vitals and nursing note reviewed.  Constitutional:      General: She is not in acute distress.    Appearance: Normal appearance. She is not ill-appearing, toxic-appearing or diaphoretic.     Comments: Well-developed elderly female.  She is lying in the semi-Fowlers position.  She speaks clearly and coherently.  HENT:     Head: Normocephalic and atraumatic.     Right Ear: External ear normal.     Left Ear: External ear normal.     Nose: Nose normal.     Mouth/Throat:     Mouth: Mucous membranes are moist.      Pharynx: Oropharynx is clear. No oropharyngeal exudate or posterior oropharyngeal erythema.  Eyes:     General: No scleral icterus.       Right eye: No discharge.        Left eye: No discharge.     Extraocular Movements: Extraocular movements intact.     Conjunctiva/sclera: Conjunctivae normal.  Neck:     Comments: No tenderness appreciated along the midline cervical spine. Cardiovascular:     Rate and Rhythm: Regular rhythm. Tachycardia present.     Pulses: Normal pulses.     Heart sounds: Normal heart sounds. No murmur heard.  No friction rub. No gallop.   Pulmonary:     Effort: Pulmonary effort is normal. No respiratory distress.     Breath sounds: Normal breath sounds. No stridor. No wheezing, rhonchi or rales.  Abdominal:     General: Abdomen is flat.     Palpations: Abdomen is soft.     Tenderness: There is no abdominal tenderness.     Comments: Abdomen is soft and nontender.    Musculoskeletal:        General: Normal range of motion.     Cervical back: Normal range of motion and neck supple. No tenderness.     Right lower leg: No edema.     Left lower leg: No edema.     Comments: No tenderness appreciated across all 4 extremities.  No anterior chest wall pain.  No tenderness appreciated with manipulation of the pelvis.  Patient is able to move all 4 extremities without difficulty.  Skin:    General: Skin is warm and dry.  Neurological:     General: No focal deficit present.     Mental Status: She is alert and oriented to person, place, and time.     Comments: Strength is 5 out of 5 in all 4 extremities.  Distal sensation intact.  Patient is speaking clearly, coherently, in complete sentences.  Psychiatric:        Mood and Affect: Mood normal.        Behavior: Behavior normal.    ED Results / Procedures / Treatments   Labs (all labs ordered are listed, but only abnormal results are displayed) Labs Reviewed  RESPIRATORY PANEL BY RT PCR (FLU A&B, COVID)  COMPREHENSIVE  METABOLIC PANEL  CBC WITH DIFFERENTIAL/PLATELET  URINALYSIS, ROUTINE W REFLEX MICROSCOPIC   EKG None  Radiology CT Head Wo Contrast  Result Date: 06/17/2020 CLINICAL DATA:  Pain following fall.  Nausea. EXAM: CT HEAD WITHOUT CONTRAST TECHNIQUE: Contiguous axial images were obtained from the base of the skull through  the vertex without intravenous contrast. COMPARISON:  July 28, 2010 FINDINGS: Brain: There is age related volume loss. There is no intracranial mass, hemorrhage, extra-axial fluid collection, or midline shift. There is small vessel disease in the centra semiovale bilaterally. Prior tiny lacunar infarct in the anterior limb of the left internal capsule noted. Age uncertain small lacunar infarct in the left thalamus noted. Elsewhere, brain parenchyma unremarkable. Vascular: No hyperdense vessel. There is calcification in each carotid siphon region. Skull: Bony calvarium appears intact. Sinuses/Orbits: Mucosal thickening noted in several ethmoid air cells. Orbits appear symmetric bilaterally. Other: Mastoid air cells are clear. IMPRESSION: Age related volume loss with patchy periventricular small vessel disease. Prior tiny lacunar infarct in the anterior limb of the left internal capsule. Age uncertain small infarct in the left thalamus. Elsewhere brain parenchyma appears unremarkable. Foci of arterial vascular calcification noted. Mucosal thickening noted in several ethmoid air cells. Electronically Signed   By: Lowella Grip III M.D.   On: 06/17/2020 20:21   CT Chest W Contrast  Result Date: 06/17/2020 CLINICAL DATA:  Chest pain shortness of breath, effusion EXAM: CT CHEST WITH CONTRAST TECHNIQUE: Multidetector CT imaging of the chest was performed during intravenous contrast administration. CONTRAST:  54mL OMNIPAQUE IOHEXOL 300 MG/ML  SOLN COMPARISON:  Radiograph 06/17/2020 FINDINGS: Cardiovascular: Cardiac size is top normal. Trace pericardial effusion with fluid in the  pericardial recesses. Three-vessel coronary artery atherosclerosis. The aortic root is suboptimally assessed given cardiac pulsation artifact. Atherosclerotic plaque within the normal caliber aorta. No acute luminal abnormality of the imaged aorta. No periaortic stranding or hemorrhage. Normal 3 vessel branching of the aortic arch. Proximal great vessels are heavily calcified but otherwise unremarkable. Suboptimal opacification of the normal caliber central pulmonary arteries on this non tailored exam. No large central filling defect. No major venous abnormality Mediastinum/Nodes: Fluid in the pericardial recesses, as above scattered prominent though nonenlarged mediastinal and hilar nodes for instance an 8 mm AP window lymph node (2/55), and a 9 mm subcarinal node 2/64. no acute abnormality of the trachea small sliding-type hernia. Heterogeneous multinodular thyroid gland with multiple hypoattenuating and partially calcified nodules, largest measuring up to 1.8 cm in the left lobe thyroid. Lungs/Pleura: Complex loculated left pleural effusion extending within the fissures and over the left lung apex. Adjacent areas of passive atelectasis are present in the left lung base with fairly uniform enhancement of the underlying, atelectatic lung parenchyma. There is a trace right pleural effusion with minimal adjacent atelectasis in the lung right lung. 4 mm subpleural nodule seen in the medial right lung apex (4/30). Small 4 mm ground-glass nodule present in the posterior segment right upper lobe as well (4/55). No other discrete pulmonary nodules or masses. No pneumothorax. Features of mild interstitial edema including some vascular redistribution interlobular septal thickening. Upper Abdomen: No acute abnormalities present in the visualized portions of the upper abdomen. Bilobed, fluid attenuation cystic lesion in the right lobe liver (2/120) measuring up to a 1.7 cm in size. No other focal liver lesions.  Musculoskeletal: Diffuse mild body wall edema, left greater than right. Some nonspecific focal skin thickening seen along the lower inner quadrant of the right breast, could reflect some dermal irritation, 2/87, correlate with visual inspection. No other focal or worrisome chest wall lesions. The osseous structures appear diffusely demineralized which may limit detection of small or nondisplaced fractures. Multilevel degenerative changes are present in the imaged portions of the spine. Additional degenerative changes in the shoulders. No acute or worrisome osseous lesions. IMPRESSION:  1. Complex loculated left pleural effusion extending within the fissures and over the left lung apex. Adjacent areas of passive atelectasis in the left lung base. Fairly uniform enhancement of the underlying, atelectatic lung parenchyma though underlying consolidation or lesion is not fully excluded. 2. Trace right pleural effusion with minimal adjacent atelectasis in the lung right lung. 3. Features of mild interstitial edema including some vascular redistribution interlobular septal thickening. 4. Three-vessel coronary artery atherosclerosis. 5. Heterogeneous multinodular thyroid gland. In the setting of significant comorbidities or limited life expectancy, no follow-up recommended (ref: J Am Coll Radiol. 2015 Feb;12(2): 143-50). 6. At least 2 small nodules 1 appearing solid in the other sub solid in nature within the right upper lobe. Non-contrast chest CT at 3-6 months is recommended. If nodules persist and are stable at that time, consider additional non-contrast chest CT examinations at 2 and 4 years. This recommendation follows the consensus statement: Guidelines for Management of Incidental Pulmonary Nodules Detected on CT Images: From the Fleischner Society 2017; Radiology 2017; 284:228-243. 7. Some nonspecific focal skin thickening along the lower inner quadrant of the right breast, correlate with visual inspection. 8. Small  sliding-type hernia. 9. Aortic Atherosclerosis (ICD10-I70.0). Electronically Signed   By: Lovena Le M.D.   On: 06/17/2020 22:26   DG Chest Portable 1 View  Result Date: 06/17/2020 CLINICAL DATA:  86 year old female with cough, weakness. Fall today. EXAM: PORTABLE CHEST 1 VIEW COMPARISON:  Portable chest 07/28/2010. FINDINGS: Portable AP upright view at 2043 hours. Moderate opacification of the left hemithorax, most resembles a pleural effusion with dense opacification of the lung base. Some of the mediastinal contours are also obscured, and there is increased density at the left hilum. The right lung appears clear allowing for portable technique. No pneumothorax. Right mediastinal contours appear normal. Calcified aortic atherosclerosis. Chronic deviation of the trachea to the right is stable. No acute rib fracture identified. No acute osseous abnormality identified. Paucity of bowel gas in the upper abdomen. IMPRESSION: 1. Opacification of the left lower lung. At least part of this is felt to reflect pleural effusion but underlying consolidation or mass cannot be excluded, and there is suspicious increased density at the left hilum. Chest CT (IV contrast preferred) would best evaluate further. 2. Right lung is clear.  No acute traumatic injury is identified. Electronically Signed   By: Genevie Ann M.D.   On: 06/17/2020 21:24    Procedures Procedures (including critical care time)  Medications Ordered in ED Medications - No data to display  ED Course  I have reviewed the triage vital signs and the nursing notes.  Pertinent labs & imaging results that were available during my care of the patient were reviewed by me and considered in my medical decision making (see chart for details).  Clinical Course as of Jun 17 2317  Wed Jun 17, 2020  2044 Age related volume loss with patchy periventricular small vessel disease. Prior tiny lacunar infarct in the anterior limb of the left internal capsule. Age  uncertain small infarct in the left thalamus. Elsewhere brain parenchyma appears unremarkable.  Foci of arterial vascular calcification noted. Mucosal thickening noted in several ethmoid air cells.  CT Head Wo Contrast [LJ]  2138 WBC(!): 16.6 [LJ]  2138 NEUT#(!): 13.8 [LJ]  4626 86 year old female here after fall in which her knees buckled on her while she was seeing a walker at home.  Fell back and hit her head.  Was unable to get herself up.  She is also noticed  some soreness in the left side of her chest after getting caught up in the sleep of her jacket.  Chest x-ray showing large pleural effusion on the left.  CT had benign.  Getting CT chest.  Disposition per results of testing.   [MB]  2206 SARS Coronavirus 2 by RT PCR: NEGATIVE [LJ]  2206 Influenza A By PCR: NEGATIVE [LJ]  2206 Influenza B By PCR: NEGATIVE [LJ]  2219 1. Opacification of the left lower lung. At least part of this is felt to reflect pleural effusion but underlying consolidation or mass cannot be excluded, and there is suspicious increased density at the left hilum. Chest CT (IV contrast preferred) would best evaluate further.  2. Right lung is clear. No acute traumatic injury is identified.  DG Chest Portable 1 View [LJ]  5638 CT scan reviewed myself with findings as noted below:  1. Complex loculated left pleural effusion extending within the fissures and over the left lung apex. Adjacent areas of passive atelectasis in the left lung base. Fairly uniform enhancement of the underlying, atelectatic lung parenchyma though underlying consolidation or lesion is not fully excluded. 2. Trace right pleural effusion with minimal adjacent atelectasis in the lung right lung. 3. Features of mild interstitial edema including some vascular redistribution interlobular septal thickening. 4. Three-vessel coronary artery atherosclerosis. 5. Heterogeneous multinodular thyroid gland. In the setting of significant comorbidities or limited  life expectancy, no follow-up recommended (ref: J Am Coll Radiol. 2015 Feb;12(2): 143-50). 6. At least 2 small nodules 1 appearing solid in the other sub solid in nature within the right upper lobe. Non-contrast chest CT at 3-6 months is recommended. If nodules persist and are stable at that time, consider additional non-contrast chest CT examinations at 2 and 4 years. This recommendation follows the consensus statement: Guidelines for Management of Incidental Pulmonary Nodules Detected on CT Images: From the Fleischner Society 2017; Radiology 2017; 284:228-243. 7. Some nonspecific focal skin thickening along the lower inner quadrant of the right breast, correlate with visual inspection. 8. Small sliding-type hernia. 9. Aortic Atherosclerosis (ICD10-I70.0).  CT Chest W Contrast [LJ]    Clinical Course User Index [LJ] Rayna Sexton, PA-C [MB] Hayden Rasmussen, MD   MDM Rules/Calculators/A&P                          Patient is a 86 year old female who presents to the emergency department due to a fall that occurred this afternoon.  Patient fell and ambulate with a walker, lower herself to the floor, and was unable to get herself back to a standing position.  Patient states that a neighbor checked on her and was able to call EMS and have her transported to the emergency department.  Patient states that she does not have any family in the area and lives alone.  She is independent and is typically able to perform her ADLs on her own.  Patient states that she has been feeling weak for the past 4 days.  Poor p.o. intake.  Upon arrival she was tachycardic with a slight elevation in her temperature at 99.4 Fahrenheit.  Mildly hypertensive.  Not hypoxic.  CBC shows a leukocytosis of 16.6 with a neutrophilia of 13.8.  Mild hypokalemia 3.3 for which she was given [place]-Con.  Respiratory panel negative.  I obtained a chest x-ray and radiology requested that I obtain a CT of the chest.  This is showing a  complex loculated left-sided pleural effusion extending within the fissures  and over the left lung apex.  Patient was given some food and her vitals were rechecked.  She is still tachycardic and hypertensive.  She states she is still feeling quite weak.  Given her age, lack of help at home, significant leukocytosis, CT findings, will discuss with hospitalist for admission.  Pt discussed with hospitalist team who will accept her into their care. COVID test ordered and is negative.   Note: Portions of this report may have been transcribed using voice recognition software. Every effort was made to ensure accuracy; however, inadvertent computerized transcription errors may be present.   Final Clinical Impression(s) / ED Diagnoses Final diagnoses:  Fall, initial encounter  Weakness  Pleural effusion  Thyroid nodule    Rx / DC Orders ED Discharge Orders    None       Rayna Sexton, PA-C 06/17/20 2348    Hayden Rasmussen, MD 06/18/20 1049

## 2020-06-17 NOTE — ED Notes (Signed)
Patient became short of breath after going to the bathroom. Patient placed back on 2 liters of oxygen.

## 2020-06-17 NOTE — ED Triage Notes (Addendum)
Patient states fall at home tonight. Patient states weakness since Sunday. Patient states nausea but no vomiting. Patient denies pain from the fall. EMS gave 200 ml's of Normal Saline fluid.

## 2020-06-18 ENCOUNTER — Observation Stay (HOSPITAL_COMMUNITY): Payer: PPO

## 2020-06-18 ENCOUNTER — Encounter (HOSPITAL_COMMUNITY): Payer: Self-pay | Admitting: Internal Medicine

## 2020-06-18 DIAGNOSIS — I1310 Hypertensive heart and chronic kidney disease without heart failure, with stage 1 through stage 4 chronic kidney disease, or unspecified chronic kidney disease: Secondary | ICD-10-CM | POA: Diagnosis present

## 2020-06-18 DIAGNOSIS — R531 Weakness: Secondary | ICD-10-CM

## 2020-06-18 DIAGNOSIS — J918 Pleural effusion in other conditions classified elsewhere: Secondary | ICD-10-CM | POA: Diagnosis present

## 2020-06-18 DIAGNOSIS — Z888 Allergy status to other drugs, medicaments and biological substances status: Secondary | ICD-10-CM | POA: Diagnosis not present

## 2020-06-18 DIAGNOSIS — Z833 Family history of diabetes mellitus: Secondary | ICD-10-CM | POA: Diagnosis not present

## 2020-06-18 DIAGNOSIS — R0902 Hypoxemia: Secondary | ICD-10-CM

## 2020-06-18 DIAGNOSIS — J9601 Acute respiratory failure with hypoxia: Secondary | ICD-10-CM | POA: Diagnosis present

## 2020-06-18 DIAGNOSIS — Z9889 Other specified postprocedural states: Secondary | ICD-10-CM | POA: Diagnosis not present

## 2020-06-18 DIAGNOSIS — E876 Hypokalemia: Secondary | ICD-10-CM | POA: Diagnosis present

## 2020-06-18 DIAGNOSIS — M8580 Other specified disorders of bone density and structure, unspecified site: Secondary | ICD-10-CM | POA: Diagnosis present

## 2020-06-18 DIAGNOSIS — W1830XA Fall on same level, unspecified, initial encounter: Secondary | ICD-10-CM | POA: Diagnosis present

## 2020-06-18 DIAGNOSIS — J90 Pleural effusion, not elsewhere classified: Secondary | ICD-10-CM | POA: Diagnosis not present

## 2020-06-18 DIAGNOSIS — E1122 Type 2 diabetes mellitus with diabetic chronic kidney disease: Secondary | ICD-10-CM | POA: Diagnosis present

## 2020-06-18 DIAGNOSIS — E441 Mild protein-calorie malnutrition: Secondary | ICD-10-CM | POA: Diagnosis present

## 2020-06-18 DIAGNOSIS — Z20822 Contact with and (suspected) exposure to covid-19: Secondary | ICD-10-CM | POA: Diagnosis present

## 2020-06-18 DIAGNOSIS — M25551 Pain in right hip: Secondary | ICD-10-CM | POA: Diagnosis present

## 2020-06-18 DIAGNOSIS — E8809 Other disorders of plasma-protein metabolism, not elsewhere classified: Secondary | ICD-10-CM | POA: Diagnosis present

## 2020-06-18 DIAGNOSIS — Y929 Unspecified place or not applicable: Secondary | ICD-10-CM | POA: Diagnosis not present

## 2020-06-18 DIAGNOSIS — J189 Pneumonia, unspecified organism: Secondary | ICD-10-CM | POA: Diagnosis present

## 2020-06-18 DIAGNOSIS — N1832 Chronic kidney disease, stage 3b: Secondary | ICD-10-CM | POA: Diagnosis present

## 2020-06-18 DIAGNOSIS — Z91030 Bee allergy status: Secondary | ICD-10-CM | POA: Diagnosis not present

## 2020-06-18 DIAGNOSIS — W19XXXA Unspecified fall, initial encounter: Secondary | ICD-10-CM

## 2020-06-18 DIAGNOSIS — E041 Nontoxic single thyroid nodule: Secondary | ICD-10-CM | POA: Diagnosis present

## 2020-06-18 DIAGNOSIS — Z66 Do not resuscitate: Secondary | ICD-10-CM | POA: Diagnosis present

## 2020-06-18 LAB — BODY FLUID CELL COUNT WITH DIFFERENTIAL
Eos, Fluid: 0 %
Lymphs, Fluid: 17 %
Monocyte-Macrophage-Serous Fluid: 3 % — ABNORMAL LOW (ref 50–90)
Neutrophil Count, Fluid: 80 % — ABNORMAL HIGH (ref 0–25)
Total Nucleated Cell Count, Fluid: 1307 cu mm — ABNORMAL HIGH (ref 0–1000)

## 2020-06-18 LAB — CBC WITH DIFFERENTIAL/PLATELET
Abs Immature Granulocytes: 0.1 10*3/uL — ABNORMAL HIGH (ref 0.00–0.07)
Basophils Absolute: 0 10*3/uL (ref 0.0–0.1)
Basophils Relative: 0 %
Eosinophils Absolute: 0.1 10*3/uL (ref 0.0–0.5)
Eosinophils Relative: 0 %
HCT: 34.6 % — ABNORMAL LOW (ref 36.0–46.0)
Hemoglobin: 11.7 g/dL — ABNORMAL LOW (ref 12.0–15.0)
Immature Granulocytes: 1 %
Lymphocytes Relative: 5 %
Lymphs Abs: 0.7 10*3/uL (ref 0.7–4.0)
MCH: 29.7 pg (ref 26.0–34.0)
MCHC: 33.8 g/dL (ref 30.0–36.0)
MCV: 87.8 fL (ref 80.0–100.0)
Monocytes Absolute: 1.2 10*3/uL — ABNORMAL HIGH (ref 0.1–1.0)
Monocytes Relative: 8 %
Neutro Abs: 12.2 10*3/uL — ABNORMAL HIGH (ref 1.7–7.7)
Neutrophils Relative %: 86 %
Platelets: 462 10*3/uL — ABNORMAL HIGH (ref 150–400)
RBC: 3.94 MIL/uL (ref 3.87–5.11)
RDW: 12.6 % (ref 11.5–15.5)
WBC: 14.3 10*3/uL — ABNORMAL HIGH (ref 4.0–10.5)
nRBC: 0 % (ref 0.0–0.2)

## 2020-06-18 LAB — COMPREHENSIVE METABOLIC PANEL
ALT: 12 U/L (ref 0–44)
AST: 11 U/L — ABNORMAL LOW (ref 15–41)
Albumin: 2.5 g/dL — ABNORMAL LOW (ref 3.5–5.0)
Alkaline Phosphatase: 113 U/L (ref 38–126)
Anion gap: 12 (ref 5–15)
BUN: 11 mg/dL (ref 8–23)
CO2: 24 mmol/L (ref 22–32)
Calcium: 8.8 mg/dL — ABNORMAL LOW (ref 8.9–10.3)
Chloride: 100 mmol/L (ref 98–111)
Creatinine, Ser: 0.41 mg/dL — ABNORMAL LOW (ref 0.44–1.00)
GFR, Estimated: >60 mL/min (ref >60)
Glucose, Bld: 138 mg/dL — ABNORMAL HIGH (ref 70–99)
Potassium: 3.4 mmol/L — ABNORMAL LOW (ref 3.5–5.1)
Sodium: 136 mmol/L (ref 135–145)
Total Bilirubin: 0.9 mg/dL (ref 0.3–1.2)
Total Protein: 6.3 g/dL — ABNORMAL LOW (ref 6.5–8.1)

## 2020-06-18 LAB — GRAM STAIN: Gram Stain: NONE SEEN

## 2020-06-18 LAB — TSH: TSH: 0.368 u[IU]/mL (ref 0.350–4.500)

## 2020-06-18 LAB — LACTATE DEHYDROGENASE, PLEURAL OR PERITONEAL FLUID: LD, Fluid: 1021 U/L — ABNORMAL HIGH (ref 3–23)

## 2020-06-18 LAB — PROTEIN, BODY FLUID (OTHER): Total Protein, Body Fluid Other: 4.2

## 2020-06-18 LAB — VITAMIN B12: Vitamin B-12: 228 pg/mL (ref 180–914)

## 2020-06-18 LAB — MAGNESIUM: Magnesium: 1.8 mg/dL (ref 1.7–2.4)

## 2020-06-18 MED ORDER — IPRATROPIUM BROMIDE 0.02 % IN SOLN: 0.5000 mg | Freq: Two times a day (BID) | RESPIRATORY_TRACT | Status: DC

## 2020-06-18 MED ORDER — ACETAMINOPHEN 325 MG PO TABS: 650.0000 mg | ORAL_TABLET | Freq: Four times a day (QID) | ORAL | Status: DC | PRN

## 2020-06-18 MED ORDER — POLYETHYLENE GLYCOL 3350 17 G PO PACK: 17.0000 g | PACK | Freq: Every day | ORAL | Status: DC | PRN

## 2020-06-18 MED ORDER — LOSARTAN POTASSIUM 50 MG PO TABS
100.0000 mg | ORAL_TABLET | Freq: Every day | ORAL | Status: DC
  Administered 2020-06-18 – 2020-06-22 (×5): 100 mg via ORAL
  Filled 2020-06-18 (×4): qty 2
  Filled 2020-06-18: qty 4

## 2020-06-18 MED ORDER — LOSARTAN POTASSIUM-HCTZ 100-12.5 MG PO TABS: 1.0000 | ORAL_TABLET | Freq: Every day | ORAL | Status: DC

## 2020-06-18 MED ORDER — HEPARIN SODIUM (PORCINE) 5000 UNIT/ML IJ SOLN
5000.0000 [IU] | Freq: Three times a day (TID) | INTRAMUSCULAR | Status: DC
  Administered 2020-06-18 – 2020-06-22 (×13): 5000 [IU] via SUBCUTANEOUS
  Filled 2020-06-18 (×13): qty 1

## 2020-06-18 MED ORDER — OXYCODONE HCL 5 MG PO TABS
5.0000 mg | ORAL_TABLET | Freq: Four times a day (QID) | ORAL | Status: DC | PRN
  Administered 2020-06-18 – 2020-06-19 (×2): 5 mg via ORAL
  Filled 2020-06-18 (×2): qty 1

## 2020-06-18 MED ORDER — HYDROCHLOROTHIAZIDE 12.5 MG PO CAPS
12.5000 mg | ORAL_CAPSULE | Freq: Every day | ORAL | Status: DC
  Administered 2020-06-18 – 2020-06-22 (×5): 12.5 mg via ORAL
  Filled 2020-06-18 (×5): qty 1

## 2020-06-18 MED ORDER — POTASSIUM CHLORIDE 20 MEQ PO PACK
20.0000 meq | PACK | Freq: Two times a day (BID) | ORAL | Status: DC
  Administered 2020-06-18 – 2020-06-22 (×8): 20 meq via ORAL
  Filled 2020-06-18 (×8): qty 1

## 2020-06-18 MED ORDER — OXYCODONE HCL 5 MG PO TABS: 5.0000 mg | ORAL_TABLET | ORAL | Status: DC | PRN

## 2020-06-18 MED ORDER — AMLODIPINE BESYLATE 5 MG PO TABS
10.0000 mg | ORAL_TABLET | Freq: Every day | ORAL | Status: DC
  Administered 2020-06-18 – 2020-06-22 (×5): 10 mg via ORAL
  Filled 2020-06-18 (×5): qty 2

## 2020-06-18 MED ORDER — ONDANSETRON HCL 4 MG/2ML IJ SOLN: 4.0000 mg | Freq: Four times a day (QID) | INTRAMUSCULAR | Status: DC | PRN

## 2020-06-18 MED ORDER — ALBUTEROL SULFATE (2.5 MG/3ML) 0.083% IN NEBU: 2.5000 mg | INHALATION_SOLUTION | Freq: Four times a day (QID) | RESPIRATORY_TRACT | Status: DC | PRN

## 2020-06-18 MED ORDER — ONDANSETRON HCL 4 MG PO TABS: 4.0000 mg | ORAL_TABLET | Freq: Four times a day (QID) | ORAL | Status: DC | PRN

## 2020-06-18 MED ORDER — SODIUM CHLORIDE 0.9 % IV SOLN
500.0000 mg | INTRAVENOUS | Status: DC
  Administered 2020-06-18 – 2020-06-19 (×2): 500 mg via INTRAVENOUS
  Filled 2020-06-18 (×2): qty 500

## 2020-06-18 MED ORDER — IPRATROPIUM BROMIDE 0.02 % IN SOLN
0.5000 mg | Freq: Two times a day (BID) | RESPIRATORY_TRACT | Status: DC
  Administered 2020-06-18 – 2020-06-22 (×8): 0.5 mg via RESPIRATORY_TRACT
  Filled 2020-06-18 (×7): qty 2.5

## 2020-06-18 MED ORDER — ASPIRIN 81 MG PO CHEW
81.0000 mg | CHEWABLE_TABLET | Freq: Every day | ORAL | Status: DC
  Administered 2020-06-18 – 2020-06-22 (×5): 81 mg via ORAL
  Filled 2020-06-18 (×5): qty 1

## 2020-06-18 MED ORDER — SODIUM CHLORIDE 0.9 % IV SOLN
1.0000 g | INTRAVENOUS | Status: DC
  Administered 2020-06-18 – 2020-06-19 (×2): 1 g via INTRAVENOUS
  Filled 2020-06-18 (×2): qty 10

## 2020-06-18 MED ORDER — ACETAMINOPHEN 650 MG RE SUPP: 650.0000 mg | Freq: Four times a day (QID) | RECTAL | Status: DC | PRN

## 2020-06-18 NOTE — ED Notes (Signed)
Whitney Watts Daughter [phone] for the doctor to call with updates.

## 2020-06-18 NOTE — Sedation Documentation (Signed)
PT tolerated thoracentesis procedure well today and 559mL of slightly opaque yellow fluid removed and sent to lab for testing. Post thoracentesis xray completed and pt returned to ED room 18 with NAD noted.

## 2020-06-18 NOTE — Progress Notes (Signed)
Patient seen and examined.  Admitted after midnight secondary to shortness of breath, productive cough and generalized weakness; found with left lower lobe pneumonia and concern for parapneumonic effusion.  Images demonstrating complex loculated effusion and with pending orders for thoracentesis. Patient is otherwise hemodynamically stable and with good saturation while using 2L [place] supplementation.  Currently afebrile.  Please refer to H&P written by Dr. Clearence Ped 06/18/2020 for further info/details on admission.  Plan: -follow pending thoracentesis and results of pleural fluid analysis. -Continue antibiotics; with intentions to broaden for anaerobic coverage in the setting of early empyema if pleural fluid demonstrate to be exudate. -start incentive parameter -Continue supportive care titrate down oxygen supplementation as tolerated. -after discussing GOC/advance care planning with patient she expressed wanting to be DNR/DNI.  Barton Dubois MD [phone]

## 2020-06-18 NOTE — Procedures (Signed)
Ultrasound-guided diagnostic and therapeutic left thoracentesis performed yielding 570 cc of yellow fluid. The pleural fluid was multiloculated. No immediate complications. Follow-up chest x-ray pending. A portion of the fluid was sent to the lab for preordered studies. EBL none.

## 2020-06-18 NOTE — H&P (Signed)
TRH H&P    Patient Demographics:    Whitney Watts, is a 86 y.o. female  MRN: [id number]  DOB - [birth year]  Admit Date - 06/17/2020  Referring MD/NP/PA: Melina Copa  Outpatient Primary MD for the patient is Celene Squibb, MD  Patient coming from: Home  Chief complaint-weakness   HPI:    Whitney Watts  is a 86 y.o. female, with history of hypertension and diabetes mellitus presents to the ER with chief complaint of weakness and a fall.  Patient reports that she has been so weak over the past few days, and her knees gave out today.  She reports she fell on the ground and could not get up.  She had no presyncopal symptoms prior to her fall.  She has an associated dry cough that is been also going on for couple of days.  She reports that she had left chest wall pain, that she attributed to a pulled muscle, so she has been rubbing IcyHot cream on it.  This treatment has been ineffective.  Patient denies radiation of the pain.  Patient reports that she has had no fevers.  Patient is really a pretty terrible historian.  Although she is oriented to person place and time, she is not oriented to contacts.  She reports that she saw me earlier with the man that drove me here-I drive myself to work-and she asks me to recall things that are in her home, which have obviously never been to her home.  It is unclear how much of her history is accurate.  She does report that when she fell she hit her head.  She reports the rest of her body has so much arthritis she went know if she had it or not.  She does not think she has ever had a pleural effusion before, and she does not think she is ever had a thoracentesis before.  In the ER temperature 99.4, heart rate 121, respiratory rate 24, blood pressure 162/77 White blood cell count 16.6, potassium 3.3, glucose 182 Albumin is 2.7 CT shows a loculated left pleural effusion extending within the  fissures and over the left lung apex Chest x-ray shows opacification of left lower lung EKG shows a heart rate of 116, QTc 461     Review of systems:    In addition to the HPI above,  No Fever-chills, No Headache, No changes with Vision or hearing, No problems swallowing food or Liquids, Admits to left chest wall pain lateral to the midclavicular line, shortness of breath, cough No Abdominal pain, No Nausea or Vomiting, bowel movements are regular, No Blood in stool or Urine, No dysuria, No new skin rashes or bruises, No new joints pains-aches,  No new weakness, tingling, numbness in any extremity, No recent weight gain or loss, No polyuria, polydypsia or polyphagia, No significant Mental Stressors.  All other systems reviewed and are negative.    Past History of the following :    Past Medical History:  Diagnosis Date  . DM (diabetes mellitus) ([place])   . HTN (  hypertension)       Past Surgical History:  Procedure Laterality Date  . ABDOMINAL HYSTERECTOMY    . APPENDECTOMY    . LUMBAR DISC SURGERY        Social History:      Social History   Tobacco Use  . Smoking status: Unknown If Ever Smoked  . Smokeless tobacco: Never Used  Substance Use Topics  . Alcohol use: No       Family History :     Family History  Problem Relation Age of Onset  . Diabetes Other   . Cancer Other   . Arthritis Other       Home Medications:   Prior to Admission medications   Medication Sig Start Date End Date Taking? Authorizing Provider  amLODipine (NORVASC) 10 MG tablet Take 10 mg by mouth daily.    [provider]  aspirin 81 MG tablet Take 81 mg by mouth daily.    [provider]  losartan-hydrochlorothiazide (HYZAAR) 100-12.5 MG per tablet Take 1 tablet by mouth daily.    [provider]     Allergies:     Allergies  Allergen Reactions  . Pravastatin Sodium     REACTION: Leg weakness  . Rosuvastatin     REACTION: Body aches  .  Bee Venom Swelling and Rash     Physical Exam:   Vitals  Blood pressure (!) 162/77, pulse (!) 131, temperature 99.4 F (37.4 C), temperature source Oral, resp. rate (!) 24, height 5\' 6"  (1.676 m), weight 74.8 kg, SpO2 94 %.  1.  General: Lying supine in bed in no acute distress  2. Psychiatric: Behavior normal for situation Oriented to person place and time, but subtly confused  3. Neurologic: Cranial nerves II through XII are intact, moves all 4 extremities voluntarily, no focal deficit on exam  4. HEENMT:  Head is atraumatic normocephalic, pupils reactive to light, neck is supple, trachea is midline, mucous membranes are moist  5. Respiratory : Lungs are clear to auscultation bilaterally, with diminished breath sounds in the left lung field, but no wheeze, rhonchi or crackles.   6. Cardiovascular : Heart rate is tachycardic, rhythm is regular, no murmurs rubs or gallops, no peripheral edema  7. Gastrointestinal:  Abdomen is soft, nondistended, nontender to palpation  8. Skin:  No acute lesions on limited skin exam  9.Musculoskeletal:  No peripheral edema or acute deformity    Data Review:    CBC Recent Labs  Lab 06/17/20 2038  WBC 16.6*  HGB 11.7*  HCT 34.4*  PLT 448*  MCV 88.2  MCH 30.0  MCHC 34.0  RDW 12.6  LYMPHSABS 0.6*  MONOABS 1.0  EOSABS 1.1*  BASOSABS 0.0   ------------------------------------------------------------------------------------------------------------------  Results for orders placed or performed during the hospital encounter of 06/17/20 (from the past 48 hour(s))  Respiratory Panel by RT PCR (Flu A&B, Covid) - Nasopharyngeal Swab     Status: None   Collection Time: 06/17/20  7:58 PM   Specimen: Nasopharyngeal Swab  Result Value Ref Range   SARS Coronavirus 2 by RT PCR NEGATIVE NEGATIVE    Comment: (NOTE) SARS-CoV-2 target nucleic acids are NOT DETECTED.  The SARS-CoV-2 RNA is generally detectable in upper  respiratoy specimens during the acute phase of infection. The lowest concentration of SARS-CoV-2 viral copies this assay can detect is 131 copies/mL. A negative result does not preclude SARS-Cov-2 infection and should not be used as the sole basis for treatment or other patient management  decisions. A negative result may occur with  improper specimen collection/handling, submission of specimen other than nasopharyngeal swab, presence of viral mutation(s) within the areas targeted by this assay, and inadequate number of viral copies (<131 copies/mL). A negative result must be combined with clinical observations, patient history, and epidemiological information. The expected result is Negative.  Fact Sheet for Patients:  PinkCheek.be  Fact Sheet for Healthcare Providers:  GravelBags.it  This test is no t yet approved or cleared by the [place] FDA and  has been authorized for detection and/or diagnosis of SARS-CoV-2 by FDA under an Emergency Use Authorization (EUA). This EUA will remain  in effect (meaning this test can be used) for the duration of the COVID-19 declaration under Section 564(b)(1) of the Act, 21 U.S.C. section 360bbb-3(b)(1), unless the authorization is terminated or revoked sooner.     Influenza A by PCR NEGATIVE NEGATIVE   Influenza B by PCR NEGATIVE NEGATIVE    Comment: (NOTE) The Xpert Xpress SARS-CoV-2/FLU/RSV assay is intended as an aid in  the diagnosis of influenza from Nasopharyngeal swab specimens and  should not be used as a sole basis for treatment. Nasal washings and  aspirates are unacceptable for Xpert Xpress SARS-CoV-2/FLU/RSV  testing.  Fact Sheet for Patients: PinkCheek.be  Fact Sheet for Healthcare Providers: GravelBags.it  This test is not yet approved or cleared by the [place] FDA and  has been authorized for  detection and/or diagnosis of SARS-CoV-2 by  FDA under an Emergency Use Authorization (EUA). This EUA will remain  in effect (meaning this test can be used) for the duration of the  Covid-19 declaration under Section 564(b)(1) of the Act, 21  U.S.C. section 360bbb-3(b)(1), unless the authorization is  terminated or revoked. Performed at [facility], [address] [place], [place] [address]   Comprehensive metabolic panel     Status: Abnormal   Collection Time: 06/17/20  8:38 PM  Result Value Ref Range   Sodium 132 (L) 135 - 145 mmol/L   Potassium 3.3 (L) 3.5 - 5.1 mmol/L   Chloride 98 98 - 111 mmol/L   CO2 22 22 - 32 mmol/L   Glucose, Bld 182 (H) 70 - 99 mg/dL    Comment: Glucose reference range applies only to samples taken after fasting for at least 8 hours.   BUN 13 8 - 23 mg/dL   Creatinine, Ser 0.47 0.44 - 1.00 mg/dL   Calcium 8.7 (L) 8.9 - 10.3 mg/dL   Total Protein 6.7 6.5 - 8.1 g/dL   Albumin 2.7 (L) 3.5 - 5.0 g/dL   AST 13 (L) 15 - 41 U/L   ALT 12 0 - 44 U/L   Alkaline Phosphatase 121 38 - 126 U/L   Total Bilirubin 0.8 0.3 - 1.2 mg/dL   GFR, Estimated >60 >60 mL/min   Anion gap 12 5 - 15    Comment: Performed at [facility], [address], [place],  [address]  CBC with Differential     Status: Abnormal   Collection Time: 06/17/20  8:38 PM  Result Value Ref Range   WBC 16.6 (H) 4.0 - 10.5 K/uL   RBC 3.90 3.87 - 5.11 MIL/uL   Hemoglobin 11.7 (L) 12.0 - 15.0 g/dL   HCT 34.4 (L) 36 - 46 %   MCV 88.2 80.0 - 100.0 fL   MCH 30.0 26.0 - 34.0 pg   MCHC 34.0 30.0 - 36.0 g/dL   RDW 12.6 11.5 - 15.5 %   Platelets 448 (  H) 150 - 400 K/uL   nRBC 0.0 0.0 - 0.2 %   Neutrophils Relative % 83 %   Neutro Abs 13.8 (H) 1.7 - 7.7 K/uL   Lymphocytes Relative 4 %   Lymphs Abs 0.6 (L) 0.7 - 4.0 K/uL   Monocytes Relative 6 %   Monocytes Absolute 1.0 0.1 - 1.0 K/uL   Eosinophils Relative 6 %   Eosinophils Absolute 1.1 (H) 0.0 - 0.5 K/uL   Basophils Relative 0 %   Basophils  Absolute 0.0 0.0 - 0.1 K/uL   Immature Granulocytes 1 %   Abs Immature Granulocytes 0.13 (H) 0.00 - 0.07 K/uL    Comment: Performed at [facility], [address] [place], [place] [address]  Urinalysis, Routine w reflex microscopic Urine, Clean Catch     Status: Abnormal   Collection Time: 06/17/20 11:32 PM  Result Value Ref Range   Color, Urine YELLOW YELLOW   APPearance CLEAR CLEAR   Specific Gravity, Urine 1.019 1.005 - 1.030   pH 6.0 5.0 - 8.0   Glucose, UA NEGATIVE NEGATIVE mg/dL   Hgb urine dipstick NEGATIVE NEGATIVE   Bilirubin Urine NEGATIVE NEGATIVE   Ketones, ur 20 (A) NEGATIVE mg/dL   Protein, ur NEGATIVE NEGATIVE mg/dL   Nitrite NEGATIVE NEGATIVE   Leukocytes,Ua TRACE (A) NEGATIVE   RBC / HPF 0-5 0 - 5 RBC/hpf   WBC, UA 0-5 0 - 5 WBC/hpf   Bacteria, UA NONE SEEN NONE SEEN   Squamous Epithelial / LPF 0-5 0 - 5    Comment: Performed at [facility], [address] [place], [place] [address]    Chemistries  Recent Labs  Lab 06/17/20 2038  NA 132*  K 3.3*  CL 98  CO2 22  GLUCOSE 182*  BUN 13  CREATININE 0.47  CALCIUM 8.7*  AST 13*  ALT 12  ALKPHOS 121  BILITOT 0.8   ------------------------------------------------------------------------------------------------------------------  ------------------------------------------------------------------------------------------------------------------ GFR: Estimated Creatinine Clearance: 43.5 mL/min (by C-G formula based on SCr of 0.47 mg/dL). Liver Function Tests: Recent Labs  Lab 06/17/20 2038  AST 13*  ALT 12  ALKPHOS 121  BILITOT 0.8  PROT 6.7  ALBUMIN 2.7*   No results for input(s): LIPASE, AMYLASE in the last 168 hours. No results for input(s): AMMONIA in the last 168 hours. Coagulation Profile: No results for input(s): INR, PROTIME in the last 168 hours. Cardiac Enzymes: No results for input(s): CKTOTAL, CKMB, CKMBINDEX, TROPONINI in the last 168 hours. BNP (last 3 results) No results for  input(s): PROBNP in the last 8760 hours. HbA1C: No results for input(s): HGBA1C in the last 72 hours. CBG: No results for input(s): GLUCAP in the last 168 hours. Lipid Profile: No results for input(s): CHOL, HDL, LDLCALC, TRIG, CHOLHDL, LDLDIRECT in the last 72 hours. Thyroid Function Tests: No results for input(s): TSH, T4TOTAL, FREET4, T3FREE, THYROIDAB in the last 72 hours. Anemia Panel: No results for input(s): VITAMINB12, FOLATE, FERRITIN, TIBC, IRON, RETICCTPCT in the last 72 hours.  --------------------------------------------------------------------------------------------------------------- Urine analysis:    Component Value Date/Time   COLORURINE YELLOW 06/17/2020 2332   APPEARANCEUR CLEAR 06/17/2020 2332   LABSPEC 1.019 06/17/2020 2332   PHURINE 6.0 06/17/2020 2332   GLUCOSEU NEGATIVE 06/17/2020 2332   HGBUR NEGATIVE 06/17/2020 2332   HGBUR negative 05/23/2007 0927   BILIRUBINUR NEGATIVE 06/17/2020 2332   KETONESUR 20 (A) 06/17/2020 2332   PROTEINUR NEGATIVE 06/17/2020 2332   UROBILINOGEN 0.2 07/28/2010 1321   NITRITE NEGATIVE 06/17/2020 2332   LEUKOCYTESUR TRACE (A) 06/17/2020 2332  Imaging Results:    CT Head Wo Contrast  Result Date: 06/17/2020 CLINICAL DATA:  Pain following fall.  Nausea. EXAM: CT HEAD WITHOUT CONTRAST TECHNIQUE: Contiguous axial images were obtained from the base of the skull through the vertex without intravenous contrast. COMPARISON:  July 28, 2010 FINDINGS: Brain: There is age related volume loss. There is no intracranial mass, hemorrhage, extra-axial fluid collection, or midline shift. There is small vessel disease in the centra semiovale bilaterally. Prior tiny lacunar infarct in the anterior limb of the left internal capsule noted. Age uncertain small lacunar infarct in the left thalamus noted. Elsewhere, brain parenchyma unremarkable. Vascular: No hyperdense vessel. There is calcification in each carotid siphon region. Skull: Bony  calvarium appears intact. Sinuses/Orbits: Mucosal thickening noted in several ethmoid air cells. Orbits appear symmetric bilaterally. Other: Mastoid air cells are clear. IMPRESSION: Age related volume loss with patchy periventricular small vessel disease. Prior tiny lacunar infarct in the anterior limb of the left internal capsule. Age uncertain small infarct in the left thalamus. Elsewhere brain parenchyma appears unremarkable. Foci of arterial vascular calcification noted. Mucosal thickening noted in several ethmoid air cells. Electronically Signed   By: Lowella Grip III M.D.   On: 06/17/2020 20:21   CT Chest W Contrast  Result Date: 06/17/2020 CLINICAL DATA:  Chest pain shortness of breath, effusion EXAM: CT CHEST WITH CONTRAST TECHNIQUE: Multidetector CT imaging of the chest was performed during intravenous contrast administration. CONTRAST:  38mL OMNIPAQUE IOHEXOL 300 MG/ML  SOLN COMPARISON:  Radiograph 06/17/2020 FINDINGS: Cardiovascular: Cardiac size is top normal. Trace pericardial effusion with fluid in the pericardial recesses. Three-vessel coronary artery atherosclerosis. The aortic root is suboptimally assessed given cardiac pulsation artifact. Atherosclerotic plaque within the normal caliber aorta. No acute luminal abnormality of the imaged aorta. No periaortic stranding or hemorrhage. Normal 3 vessel branching of the aortic arch. Proximal great vessels are heavily calcified but otherwise unremarkable. Suboptimal opacification of the normal caliber central pulmonary arteries on this non tailored exam. No large central filling defect. No major venous abnormality Mediastinum/Nodes: Fluid in the pericardial recesses, as above scattered prominent though nonenlarged mediastinal and hilar nodes for instance an 8 mm AP window lymph node (2/55), and a 9 mm subcarinal node 2/64. no acute abnormality of the trachea small sliding-type hernia. Heterogeneous multinodular thyroid gland with multiple  hypoattenuating and partially calcified nodules, largest measuring up to 1.8 cm in the left lobe thyroid. Lungs/Pleura: Complex loculated left pleural effusion extending within the fissures and over the left lung apex. Adjacent areas of passive atelectasis are present in the left lung base with fairly uniform enhancement of the underlying, atelectatic lung parenchyma. There is a trace right pleural effusion with minimal adjacent atelectasis in the lung right lung. 4 mm subpleural nodule seen in the medial right lung apex (4/30). Small 4 mm ground-glass nodule present in the posterior segment right upper lobe as well (4/55). No other discrete pulmonary nodules or masses. No pneumothorax. Features of mild interstitial edema including some vascular redistribution interlobular septal thickening. Upper Abdomen: No acute abnormalities present in the visualized portions of the upper abdomen. Bilobed, fluid attenuation cystic lesion in the right lobe liver (2/120) measuring up to a 1.7 cm in size. No other focal liver lesions. Musculoskeletal: Diffuse mild body wall edema, left greater than right. Some nonspecific focal skin thickening seen along the lower inner quadrant of the right breast, could reflect some dermal irritation, 2/87, correlate with visual inspection. No other focal or worrisome chest wall lesions. The  osseous structures appear diffusely demineralized which may limit detection of small or nondisplaced fractures. Multilevel degenerative changes are present in the imaged portions of the spine. Additional degenerative changes in the shoulders. No acute or worrisome osseous lesions. IMPRESSION: 1. Complex loculated left pleural effusion extending within the fissures and over the left lung apex. Adjacent areas of passive atelectasis in the left lung base. Fairly uniform enhancement of the underlying, atelectatic lung parenchyma though underlying consolidation or lesion is not fully excluded. 2. Trace right  pleural effusion with minimal adjacent atelectasis in the lung right lung. 3. Features of mild interstitial edema including some vascular redistribution interlobular septal thickening. 4. Three-vessel coronary artery atherosclerosis. 5. Heterogeneous multinodular thyroid gland. In the setting of significant comorbidities or limited life expectancy, no follow-up recommended (ref: J Am Coll Radiol. 2015 Feb;12(2): 143-50). 6. At least 2 small nodules 1 appearing solid in the other sub solid in nature within the right upper lobe. Non-contrast chest CT at 3-6 months is recommended. If nodules persist and are stable at that time, consider additional non-contrast chest CT examinations at 2 and 4 years. This recommendation follows the consensus statement: Guidelines for Management of Incidental Pulmonary Nodules Detected on CT Images: From the Fleischner Society 2017; Radiology 2017; 284:228-243. 7. Some nonspecific focal skin thickening along the lower inner quadrant of the right breast, correlate with visual inspection. 8. Small sliding-type hernia. 9. Aortic Atherosclerosis (ICD10-I70.0). Electronically Signed   By: Lovena Le M.D.   On: 06/17/2020 22:26   DG Chest Portable 1 View  Result Date: 06/17/2020 CLINICAL DATA:  86 year old female with cough, weakness. Fall today. EXAM: PORTABLE CHEST 1 VIEW COMPARISON:  Portable chest 07/28/2010. FINDINGS: Portable AP upright view at 2043 hours. Moderate opacification of the left hemithorax, most resembles a pleural effusion with dense opacification of the lung base. Some of the mediastinal contours are also obscured, and there is increased density at the left hilum. The right lung appears clear allowing for portable technique. No pneumothorax. Right mediastinal contours appear normal. Calcified aortic atherosclerosis. Chronic deviation of the trachea to the right is stable. No acute rib fracture identified. No acute osseous abnormality identified. Paucity of bowel gas  in the upper abdomen. IMPRESSION: 1. Opacification of the left lower lung. At least part of this is felt to reflect pleural effusion but underlying consolidation or mass cannot be excluded, and there is suspicious increased density at the left hilum. Chest CT (IV contrast preferred) would best evaluate further. 2. Right lung is clear.  No acute traumatic injury is identified. Electronically Signed   By: Genevie Ann M.D.   On: 06/17/2020 21:24    My personal review of EKG: Rhythm ST, Rate 116 /min, QTc 461 ,no Acute ST changes   Assessment & Plan:    Active Problems:   Pleural effusion   1. Pleural effusion 1. Loculated pleural effusion on left 2. Bedside thoracentesis in the AM 3. Tachycardia and tachypnea  4. Monitor on tele 2. Hypokalemia 1. K+ 3.3 2. Replace and recheck 3. Leukocytosis 1. With cough, tachypnea, and tachycardia 2. Most likely all of these are due to the pleural effusion, but will cover with antibiotics until infection ruled out 3. Trend WBC in the AM 4. Mechanical fall 1. CT head without any acute change 5. Protein calorie malnutrition 1. Encourage nutrient dense foods 2. 3.7   DVT Prophylaxis-  Heparin - SCDs   AM Labs Ordered, also please review Full Orders  Family Communication: No family at bedside  Code Status:  Full  Admission status: Observation  Time spent in minutes : [place]

## 2020-06-18 NOTE — ED Notes (Addendum)
Attempted report 

## 2020-06-18 NOTE — ED Notes (Signed)
Report called to [facility].

## 2020-06-19 DIAGNOSIS — J90 Pleural effusion, not elsewhere classified: Secondary | ICD-10-CM | POA: Diagnosis not present

## 2020-06-19 DIAGNOSIS — J9601 Acute respiratory failure with hypoxia: Secondary | ICD-10-CM | POA: Diagnosis not present

## 2020-06-19 DIAGNOSIS — Z9889 Other specified postprocedural states: Secondary | ICD-10-CM | POA: Diagnosis not present

## 2020-06-19 DIAGNOSIS — I10 Essential (primary) hypertension: Secondary | ICD-10-CM

## 2020-06-19 DIAGNOSIS — R531 Weakness: Secondary | ICD-10-CM | POA: Diagnosis not present

## 2020-06-19 LAB — CYTOLOGY - NON PAP

## 2020-06-19 MED ORDER — IPRATROPIUM BROMIDE 0.02 % IN SOLN
RESPIRATORY_TRACT | Status: AC
  Filled 2020-06-19: qty 2.5

## 2020-06-19 MED ORDER — VITAMIN B-12 1000 MCG PO TABS
1000.0000 ug | ORAL_TABLET | Freq: Every day | ORAL | Status: DC
  Administered 2020-06-19 – 2020-06-22 (×4): 1000 ug via ORAL
  Filled 2020-06-19 (×4): qty 1

## 2020-06-19 MED ORDER — AMOXICILLIN-POT CLAVULANATE 875-125 MG PO TABS
1.0000 | ORAL_TABLET | Freq: Two times a day (BID) | ORAL | Status: DC
  Administered 2020-06-19 – 2020-06-22 (×7): 1 via ORAL
  Filled 2020-06-19 (×9): qty 1

## 2020-06-19 MED ORDER — TRAMADOL HCL 50 MG PO TABS
50.0000 mg | ORAL_TABLET | Freq: Two times a day (BID) | ORAL | Status: DC | PRN
  Administered 2020-06-19 – 2020-06-21 (×4): 50 mg via ORAL
  Filled 2020-06-19 (×4): qty 1

## 2020-06-19 NOTE — Evaluation (Signed)
Physical Therapy Evaluation Patient Details Name: Whitney Watts MRN: [id number] DOB: 01-22-1923 Today's Date: 06/19/2020   History of Present Illness  Micheal Colasurdo  is a 86 y.o. female, with history of hypertension and diabetes mellitus presents to the ER with chief complaint of weakness and a fall.  Patient reports that she has been so weak over the past few days, and her knees gave out today.  She reports she fell on the ground and could not get up.  She had no presyncopal symptoms prior to her fall.  She has an associated dry cough that is been also going on for couple of days.  She reports that she had left chest wall pain, that she attributed to a pulled muscle, so she has been rubbing IcyHot cream on it.  This treatment has been ineffective.  Patient denies radiation of the pain.  Patient reports that she has had no fevers.  Patient is really a pretty terrible historian.  Although she is oriented to person place and time, she is not oriented to contacts.  She reports that she saw me earlier with the man that drove me here-I drive myself to work-and she asks me to recall things that are in her home, which have obviously never been to her home.  It is unclear how much of her history is accurate.  She does report that when she fell she hit her head.  She reports the rest of her body has so much arthritis she went know if she had it or not.  She does not think she has ever had a pleural effusion before, and she does not think she is ever had a thoracentesis before.    Clinical Impression  Patient demonstrates slow labored unsteady steps at bedside with frequent buckling of RLE due pain/weakness, at high risk for falls and limited secondary to fatigue and generalized weakness.  Patient required Mod assistance for moving BLE onto and repositioning self in bed.  Patient will benefit from continued physical therapy in hospital and recommended venue below to increase strength, balance, endurance for safe ADLs  and gait.    Follow Up Recommendations SNF    Equipment Recommendations  None recommended by PT    Recommendations for Other Services       Precautions / Restrictions Precautions Precautions: Fall Restrictions Weight Bearing Restrictions: No      Mobility  Bed Mobility Overal bed mobility: Needs Assistance Bed Mobility: Supine to Sit;Sit to Supine     Supine to sit: Mod assist Sit to supine: Mod assist   General bed mobility comments: increased time, labored movement, required assistance to move RLE onto bed  Transfers Overall transfer level: Needs assistance Equipment used: Rolling walker (2 wheeled) Transfers: Sit to/from [facility] Sit to Stand: Mod assist Stand pivot transfers: Mod assist;Max assist       General transfer comment: slow labored movement with frequent buckling of RLE due to weakness/discomfort  Ambulation/Gait Ambulation/Gait assistance: Mod assist;Max assist Gait Distance (Feet): 4 Feet Assistive device: Rolling walker (2 wheeled) Gait Pattern/deviations: Decreased step length - left;Decreased stance time - right;Decreased stride length;Antalgic Gait velocity: decreased   General Gait Details: limited to 4-5 slow labored short unsteady steps with frequent buckling of RLE due to pain/weakness  Stairs            Wheelchair Mobility    Modified Rankin (Stroke Patients Only)       Balance Overall balance assessment: Needs assistance Sitting-balance support: Feet supported;No upper  extremity supported Sitting balance-Leahy Scale: Fair Sitting balance - Comments: fair/good seated at EOB   Standing balance support: During functional activity;Bilateral upper extremity supported Standing balance-Leahy Scale: Poor Standing balance comment: using RW                             Pertinent Vitals/Pain Pain Assessment: 0-10 Pain Score: 5  Pain Location: right leg Pain Descriptors / Indicators:  Sore;Guarding Pain Intervention(s): Limited activity within patient's tolerance;Monitored during session;Repositioned    Home Living Family/patient expects to be discharged to:: Private residence Living Arrangements: Alone Available Help at Discharge: Neighbor;Available PRN/intermittently Type of Home: House Home Access: Ramped entrance     Home Layout: One level Home Equipment: Walker - 2 wheels;Walker - 4 wheels;Cane - single point;Shower seat - built in      Prior Function Level of Independence: Independent with assistive device(s)         Comments: Household and short distanced community distances using SPC or RW     Hand Dominance        Extremity/Trunk Assessment   Upper Extremity Assessment Upper Extremity Assessment: Generalized weakness    Lower Extremity Assessment Lower Extremity Assessment: Generalized weakness    Cervical / Trunk Assessment Cervical / Trunk Assessment: Kyphotic  Communication   Communication: No difficulties  Cognition Arousal/Alertness: Awake/alert Behavior During Therapy: WFL for tasks assessed/performed Overall Cognitive Status: Within Functional Limits for tasks assessed                                        General Comments      Exercises     Assessment/Plan    PT Assessment Patient needs continued PT services  PT Problem List Decreased strength;Decreased activity tolerance;Decreased balance;Decreased mobility       PT Treatment Interventions DME instruction;Gait training;Balance training;Stair training;Functional mobility training;Therapeutic activities;Therapeutic exercise;Patient/family education    PT Goals (Current goals can be found in the Care Plan section)  Acute Rehab PT Goals Patient Stated Goal: return home after rehab PT Goal Formulation: With patient Time For Goal Achievement: 07/03/20 Potential to Achieve Goals: Good    Frequency Min 3X/week   Barriers to discharge         Co-evaluation               AM-PAC PT "6 Clicks" Mobility  Outcome Measure Help needed turning from your back to your side while in a flat bed without using bedrails?: A Lot Help needed moving from lying on your back to sitting on the side of a flat bed without using bedrails?: A Lot Help needed moving to and from a bed to a chair (including a wheelchair)?: A Lot Help needed standing up from a chair using your arms (e.g., wheelchair or bedside chair)?: A Lot Help needed to walk in hospital room?: A Lot Help needed climbing 3-5 steps with a railing? : Total 6 Click Score: 11    End of Session Equipment Utilized During Treatment: Oxygen Activity Tolerance: Patient tolerated treatment well;Patient limited by fatigue Patient left: in bed;with call bell/phone within reach Nurse Communication: Mobility status PT Visit Diagnosis: Unsteadiness on feet (R26.81);Other abnormalities of gait and mobility (R26.89);Muscle weakness (generalized) (M62.81)    Time: 9628-3662 PT Time Calculation (min) (ACUTE ONLY): 31 min   Charges:   PT Evaluation $PT Eval Moderate Complexity: 1 Mod PT Treatments $  Therapeutic Activity: 23-37 mins        3:05 PM, 06/19/20 Lonell Grandchild, MPT Physical Therapist with [facility] [phone] office [phone] mobile phone

## 2020-06-19 NOTE — NC FL2 (Signed)
[place] LEVEL OF CARE SCREENING TOOL     IDENTIFICATION  Patient Name: Whitney Watts Birthdate: 02-Apr-1923 Sex: female Admission Date (Current Location): 06/17/2020  [facility] and [place] Number:  [employer] and Address:  [facility] [address] [place]      Provider Number: [id number]  Attending Physician Name and Address:  Barton Dubois, MD  Relative Name and Phone Number:  Isaiah Serge Daughter [phone]    Current Level of Care: Hospital Recommended Level of Care: [place] Prior Approval Number:    Date Approved/Denied:   PASRR Number: [id number]  Discharge Plan: SNF    Current Diagnoses: Patient Active Problem List   Diagnosis Date Noted  . Pleural effusion 06/18/2020  . Acute respiratory failure with hypoxia ([place]) 06/18/2020  . OA (osteoarthritis) of knee 04/17/2013  . Right knee pain 04/17/2013  . DIABETES MELLITUS, TYPE II, CONTROLLED 03/06/2009  . OBESITY 07/18/2008  . MYALGIA 04/02/2008  . ALLERGIC RHINITIS 01/02/2008  . ABNORMAL ELECTROCARDIOGRAM 01/03/2007  . DIABETES MELLITUS, TYPE II, UNCONTROLLED 10/11/2006  . HYPERLIPIDEMIA 10/11/2006  . DYSMETABOLIC SYNDROME 26/94/8546  . POLYCYTHEMIA 10/11/2006  . ESSENTIAL HYPERTENSION 10/11/2006  . ABDOMINAL INCISIONAL HERNIA 10/11/2006  . CATARACT NOS 10/02/2006  . CONSTIPATION NOS 10/02/2006  . ABSCESS, [place] GLAND 10/02/2006  . OSTEOARTHRITIS 10/02/2006  . LOW BACK PAIN 10/02/2006    Orientation RESPIRATION BLADDER Height & Weight     Self, Time, Situation, Place  O2 (2L) Continent Weight: 147 lb 11.3 oz (67 kg) Height:  5\' 6"  (167.6 cm)  BEHAVIORAL SYMPTOMS/MOOD NEUROLOGICAL BOWEL NUTRITION STATUS      Continent Diet (heart healthy)  AMBULATORY STATUS COMMUNICATION OF NEEDS Skin   Limited Assist Verbally Normal                       Personal Care Assistance Level of Assistance  Bathing,  Dressing, Feeding Bathing Assistance: Limited assistance Feeding assistance: Independent Dressing Assistance: Limited assistance     Functional Limitations Info  Sight, Hearing, Speech Sight Info: Adequate Hearing Info: Adequate Speech Info: Adequate    SPECIAL CARE FACTORS FREQUENCY  PT (By licensed PT)     PT Frequency: 5x/week              Contractures Contractures Info: Not present    Additional Factors Info  Code Status, Allergies Code Status Info: DNR Allergies Info: Pravastatin Sodium, Rosuvastatin, Bee Venom     Isolation Precautions Info: none     Current Medications (06/19/2020):  This is the current hospital active medication list Current Facility-Administered Medications  Medication Dose Route Frequency Provider Last Rate Last Admin  . acetaminophen (TYLENOL) tablet 650 mg  650 mg Oral Q6H PRN Zierle-Ghosh, Asia B, DO       Or  . acetaminophen (TYLENOL) suppository 650 mg  650 mg Rectal Q6H PRN Zierle-Ghosh, Asia B, DO      . albuterol (PROVENTIL) (2.5 MG/3ML) 0.083% nebulizer solution 2.5 mg  2.5 mg Nebulization Q6H PRN Zierle-Ghosh, Asia B, DO      . amLODipine (NORVASC) tablet 10 mg  10 mg Oral Daily Zierle-Ghosh, Asia B, DO   10 mg at 06/19/20 1101  . amoxicillin-clavulanate (AUGMENTIN) 875-125 MG per tablet 1 tablet  1 tablet Oral Q12H Barton Dubois, MD   1 tablet at 06/19/20 1409  . aspirin chewable tablet 81 mg  81 mg Oral Daily Zierle-Ghosh, Asia B, DO   81 mg at 06/19/20  1100  . heparin injection 5,000 Units  5,000 Units Subcutaneous Q8H Zierle-Ghosh, Asia B, DO   5,000 Units at 06/19/20 1410  . losartan (COZAAR) tablet 100 mg  100 mg Oral Daily Zierle-Ghosh, Asia B, DO   100 mg at 06/19/20 1100   And  . hydrochlorothiazide (MICROZIDE) capsule 12.5 mg  12.5 mg Oral Daily Zierle-Ghosh, Asia B, DO   12.5 mg at 06/19/20 1100  . ipratropium (ATROVENT) nebulizer solution 0.5 mg  0.5 mg Nebulization BID Barton Dubois, MD   0.5 mg at 06/19/20 1000  .  ondansetron (ZOFRAN) tablet 4 mg  4 mg Oral Q6H PRN Zierle-Ghosh, Asia B, DO       Or  . ondansetron (ZOFRAN) injection 4 mg  4 mg Intravenous Q6H PRN Zierle-Ghosh, Asia B, DO      . polyethylene glycol (MIRALAX / GLYCOLAX) packet 17 g  17 g Oral Daily PRN Zierle-Ghosh, Asia B, DO      . potassium chloride (KLOR-CON) packet 20 mEq  20 mEq Oral BID Zierle-Ghosh, Asia B, DO   20 mEq at 06/19/20 1100  . traMADol (ULTRAM) tablet 50 mg  50 mg Oral Q12H PRN Barton Dubois, MD   50 mg at 06/19/20 1409  . vitamin B-12 (CYANOCOBALAMIN) tablet 1,000 mcg  1,000 mcg Oral Daily Barton Dubois, MD   1,000 mcg at 06/19/20 1409     Discharge Medications: Please see discharge summary for a list of discharge medications.  Relevant Imaging Results:  Relevant Lab Results:   Additional Information SSN [id number] Clydene Pugh, LCSW

## 2020-06-19 NOTE — Progress Notes (Signed)
PROGRESS NOTE    Whitney Watts  TKZ:[id number] DOB: September 20, 1922 DOA: 06/17/2020 PCP: Celene Squibb, MD   Chief Complaint  Patient presents with  . Fall    Brief Narrative:  As per H&P written by Dr. Clearence Ped on 06/18/20  Whitney Watts  is a 86 y.o. female, with history of hypertension and diabetes mellitus presents to the ER with chief complaint of weakness and a fall.  Patient reports that she has been so weak over the past few days, and her knees gave out today.  She reports she fell on the ground and could not get up.  She had no presyncopal symptoms prior to her fall.  She has an associated dry cough that is been also going on for couple of days.  She reports that she had left chest wall pain, that she attributed to a pulled muscle, so she has been rubbing IcyHot cream on it.  This treatment has been ineffective.  Patient denies radiation of the pain.  Patient reports that she has had no fevers.  Patient is really a pretty terrible historian.  Although she is oriented to person place and time, she is not oriented to contacts.  She reports that she saw me earlier with the man that drove me here-I drive myself to work-and she asks me to recall things that are in her home, which have obviously never been to her home.  It is unclear how much of her history is accurate.  She does report that when she fell she hit her head.  She reports the rest of her body has so much arthritis she went know if she had it or not.  She does not think she has ever had a pleural effusion before, and she does not think she is ever had a thoracentesis before.  In the ER temperature 99.4, heart rate 121, respiratory rate 24, blood pressure 162/77 White blood cell count 16.6, potassium 3.3, glucose 182 Albumin is 2.7 CT shows a loculated left pleural effusion extending within the fissures and over the left lung apex Chest x-ray shows opacification of left lower lung EKG shows a heart rate of 116, QTc 461  Assessment  & Plan: 1-left lower lobe community-acquired pneumonia with parapneumonic effusion -Thoracentesis demonstrating fluid consistent with exudate; no microorganisms isolated, negative Gram stain. Patient afebrile and reports improvement in breathing and coughing spells. -Continue incentive respirometer, nebulizer management and transition antibiotics to Augmentin. -Wean oxygen supplementation as tolerated and assess physical activity capability.  2-Acute respiratory failure with hypoxia ([place]) -Secondary to problem #1 -Continue management as specified above -Continue to wean oxygen supplementation back to room air as tolerated. -No prior history of chronic lung problems.  3-hypertension -Continue amlodipine, losartan and HCTZ -Blood pressure stable currently.  4-borderline normal B12 level -Started daily oral supplementation of vitamin B12  5-hypokalemia in the setting of chronic diuretic usage -Continue daily supplementation. -Follow electrolytes trend  6-physical deconditioning -Patient reports feeling weak and tired -Continue treatment of precipitating cause -Physical therapy has been asked to see patient and provide recommendations.      DVT prophylaxis: Heparin Code Status: DNR Family Communication: Daughter at bedside Disposition:   Status is: Inpatient  Dispo: The patient is from: home              Anticipated d/c is to: to be determined.              Anticipated d/c date is: 06/20/20  Patient currently no medically ready for discharge yet; still short of breath with activity and using 2 L nasal cannula supplementation.  Parapneumonic effusion with the studies suggesting exudate.  Negative Gram stain with no organisms isolated.  Patient will be transition to Augmentin; plan to treat for 8 days; wean oxygen supplementation as tolerated and assess physical capability with physical therapy.       Consultants:   None    Procedures:  See below for x-ray  reports.  Antimicrobials:  Rocephin and Zithromax 10/14>>10/15 augmentin   Subjective: Afebrile, no chest pain, no nausea, no vomiting.  Reports improvement in her breathing.  Still requiring 2 L nasal cannula supplementation and feeling short of breath with activity.  Objective: Vitals:   06/18/20 2248 06/19/20 0805 06/19/20 1000 06/19/20 1039  BP: 135/74 (!) 147/68  (!) 121/94  Pulse: (!) 110 (!) 103  (!) 109  Resp: 18 18  18   Temp: 99.8 F (37.7 C) 99.6 F (37.6 C)  98.9 F (37.2 C)  TempSrc: Oral Oral  Oral  SpO2: 96% 93% 92% 93%  Weight: 67 kg     Height: 5\' 6"  (1.676 m)       Intake/Output Summary (Last 24 hours) at 06/19/2020 1233 Last data filed at 06/19/2020 0300 Gross per 24 hour  Intake 344.77 ml  Output --  Net 344.77 ml   Filed Weights   06/17/20 1900 06/17/20 1928 06/18/20 2248  Weight: 88 kg 74.8 kg 67 kg    Examination:  General exam: Appears calm and in no major distress.  Reports breathing is better today; still short of breath with activity and using 2 L nasal cannula supplementation.  No nausea, no vomiting, no chest pain. Respiratory system: Positive scattered rhonchi, no wheezing today; decreased breath sounds at the bases of her left lung field. Cardiovascular system: S1 & S2 heard, no JVD, no rubs, no gallops, no clicks; no pedal edema appreciated. Gastrointestinal system: Abdomen is nondistended, soft and nontender. No organomegaly or masses felt. Normal bowel sounds heard. Central nervous system: Alert and oriented. No focal neurological deficits. Extremities: No cyanosis or clubbing. Skin: No rashes, no petechiae. Psychiatry: Judgement and insight appear normal. Mood & affect appropriate.     Data Reviewed: I have personally reviewed following labs and imaging studies  CBC: Recent Labs  Lab 06/17/20 2038 06/18/20 0622  WBC 16.6* 14.3*  NEUTROABS 13.8* 12.2*  HGB 11.7* 11.7*  HCT 34.4* 34.6*  MCV 88.2 87.8  PLT 448* 462*     Basic Metabolic Panel: Recent Labs  Lab 06/17/20 2038 06/18/20 0622  NA 132* 136  K 3.3* 3.4*  CL 98 100  CO2 22 24  GLUCOSE 182* 138*  BUN 13 11  CREATININE 0.47 0.41*  CALCIUM 8.7* 8.8*  MG  --  1.8    GFR: Estimated Creatinine Clearance: 39.4 mL/min (A) (by C-G formula based on SCr of 0.41 mg/dL (L)).  Liver Function Tests: Recent Labs  Lab 06/17/20 2038 06/18/20 0622  AST 13* 11*  ALT 12 12  ALKPHOS 121 113  BILITOT 0.8 0.9  PROT 6.7 6.3*  ALBUMIN 2.7* 2.5*    CBG: No results for input(s): GLUCAP in the last 168 hours.   Recent Results (from the past 240 hour(s))  Respiratory Panel by RT PCR (Flu A&B, Covid) - Nasopharyngeal Swab     Status: None   Collection Time: 06/17/20  7:58 PM   Specimen: Nasopharyngeal Swab  Result Value Ref Range Status   SARS  Coronavirus 2 by RT PCR NEGATIVE NEGATIVE Final    Comment: (NOTE) SARS-CoV-2 target nucleic acids are NOT DETECTED.  The SARS-CoV-2 RNA is generally detectable in upper respiratoy specimens during the acute phase of infection. The lowest concentration of SARS-CoV-2 viral copies this assay can detect is 131 copies/mL. A negative result does not preclude SARS-Cov-2 infection and should not be used as the sole basis for treatment or other patient management decisions. A negative result may occur with  improper specimen collection/handling, submission of specimen other than nasopharyngeal swab, presence of viral mutation(s) within the areas targeted by this assay, and inadequate number of viral copies (<131 copies/mL). A negative result must be combined with clinical observations, patient history, and epidemiological information. The expected result is Negative.  Fact Sheet for Patients:  PinkCheek.be  Fact Sheet for Healthcare Providers:  GravelBags.it  This test is no t yet approved or cleared by the [place] FDA and  has been  authorized for detection and/or diagnosis of SARS-CoV-2 by FDA under an Emergency Use Authorization (EUA). This EUA will remain  in effect (meaning this test can be used) for the duration of the COVID-19 declaration under Section 564(b)(1) of the Act, 21 U.S.C. section 360bbb-3(b)(1), unless the authorization is terminated or revoked sooner.     Influenza A by PCR NEGATIVE NEGATIVE Final   Influenza B by PCR NEGATIVE NEGATIVE Final    Comment: (NOTE) The Xpert Xpress SARS-CoV-2/FLU/RSV assay is intended as an aid in  the diagnosis of influenza from Nasopharyngeal swab specimens and  should not be used as a sole basis for treatment. Nasal washings and  aspirates are unacceptable for Xpert Xpress SARS-CoV-2/FLU/RSV  testing.  Fact Sheet for Patients: PinkCheek.be  Fact Sheet for Healthcare Providers: GravelBags.it  This test is not yet approved or cleared by the [place] FDA and  has been authorized for detection and/or diagnosis of SARS-CoV-2 by  FDA under an Emergency Use Authorization (EUA). This EUA will remain  in effect (meaning this test can be used) for the duration of the  Covid-19 declaration under Section 564(b)(1) of the Act, 21  U.S.C. section 360bbb-3(b)(1), unless the authorization is  terminated or revoked. Performed at [facility], [address], [place], [place] [address]   Gram stain     Status: None   Collection Time: 06/18/20 12:34 PM   Specimen: Pleura; Body Fluid  Result Value Ref Range Status   Specimen Description PLEURAL  Final   Special Requests BOTTLES DRAWN AEROBIC AND ANAEROBIC 10CC  Final   Gram Stain   Final    NO ORGANISMS SEEN WBC PRESENT,BOTH PMN AND MONONUCLEAR Performed at [facility], [address] [place], [place] [address]    Report Status 06/18/2020 FINAL  Final  Culture, body fluid-bottle     Status: None (Preliminary result)   Collection Time: 06/18/20 12:34 PM    Specimen: Pleura  Result Value Ref Range Status   Specimen Description PLEURAL  Final   Special Requests 10CC  Final   Culture   Final    NO GROWTH < 24 HOURS Performed at [facility], [address] [place], [place] [address]    Report Status PENDING  Incomplete     Radiology Studies: DG Chest 1 View  Result Date: 06/18/2020 CLINICAL DATA:  Status post thoracentesis. EXAM: CHEST  1 VIEW COMPARISON:  Single-view of the chest and CT chest 06/17/2020. FINDINGS: Left pleural effusion is decreased after thoracentesis. No pneumothorax. Focal opacity in the left upper  lung zone is presumably due to residual atelectasis. Right lung is clear. Cardiomegaly noted. IMPRESSION: Decreased left pleural effusion after thoracentesis. No pneumothorax. Electronically Signed   By: Inge Rise M.D.   On: 06/18/2020 13:12   CT Head Wo Contrast  Result Date: 06/17/2020 CLINICAL DATA:  Pain following fall.  Nausea. EXAM: CT HEAD WITHOUT CONTRAST TECHNIQUE: Contiguous axial images were obtained from the base of the skull through the vertex without intravenous contrast. COMPARISON:  July 28, 2010 FINDINGS: Brain: There is age related volume loss. There is no intracranial mass, hemorrhage, extra-axial fluid collection, or midline shift. There is small vessel disease in the centra semiovale bilaterally. Prior tiny lacunar infarct in the anterior limb of the left internal capsule noted. Age uncertain small lacunar infarct in the left thalamus noted. Elsewhere, brain parenchyma unremarkable. Vascular: No hyperdense vessel. There is calcification in each carotid siphon region. Skull: Bony calvarium appears intact. Sinuses/Orbits: Mucosal thickening noted in several ethmoid air cells. Orbits appear symmetric bilaterally. Other: Mastoid air cells are clear. IMPRESSION: Age related volume loss with patchy periventricular small vessel disease. Prior tiny lacunar infarct in the anterior limb of the left internal capsule.  Age uncertain small infarct in the left thalamus. Elsewhere brain parenchyma appears unremarkable. Foci of arterial vascular calcification noted. Mucosal thickening noted in several ethmoid air cells. Electronically Signed   By: Lowella Grip III M.D.   On: 06/17/2020 20:21   CT Chest W Contrast  Result Date: 06/17/2020 CLINICAL DATA:  Chest pain shortness of breath, effusion EXAM: CT CHEST WITH CONTRAST TECHNIQUE: Multidetector CT imaging of the chest was performed during intravenous contrast administration. CONTRAST:  67mL OMNIPAQUE IOHEXOL 300 MG/ML  SOLN COMPARISON:  Radiograph 06/17/2020 FINDINGS: Cardiovascular: Cardiac size is top normal. Trace pericardial effusion with fluid in the pericardial recesses. Three-vessel coronary artery atherosclerosis. The aortic root is suboptimally assessed given cardiac pulsation artifact. Atherosclerotic plaque within the normal caliber aorta. No acute luminal abnormality of the imaged aorta. No periaortic stranding or hemorrhage. Normal 3 vessel branching of the aortic arch. Proximal great vessels are heavily calcified but otherwise unremarkable. Suboptimal opacification of the normal caliber central pulmonary arteries on this non tailored exam. No large central filling defect. No major venous abnormality Mediastinum/Nodes: Fluid in the pericardial recesses, as above scattered prominent though nonenlarged mediastinal and hilar nodes for instance an 8 mm AP window lymph node (2/55), and a 9 mm subcarinal node 2/64. no acute abnormality of the trachea small sliding-type hernia. Heterogeneous multinodular thyroid gland with multiple hypoattenuating and partially calcified nodules, largest measuring up to 1.8 cm in the left lobe thyroid. Lungs/Pleura: Complex loculated left pleural effusion extending within the fissures and over the left lung apex. Adjacent areas of passive atelectasis are present in the left lung base with fairly uniform enhancement of the underlying,  atelectatic lung parenchyma. There is a trace right pleural effusion with minimal adjacent atelectasis in the lung right lung. 4 mm subpleural nodule seen in the medial right lung apex (4/30). Small 4 mm ground-glass nodule present in the posterior segment right upper lobe as well (4/55). No other discrete pulmonary nodules or masses. No pneumothorax. Features of mild interstitial edema including some vascular redistribution interlobular septal thickening. Upper Abdomen: No acute abnormalities present in the visualized portions of the upper abdomen. Bilobed, fluid attenuation cystic lesion in the right lobe liver (2/120) measuring up to a 1.7 cm in size. No other focal liver lesions. Musculoskeletal: Diffuse mild body wall edema, left greater than right.  Some nonspecific focal skin thickening seen along the lower inner quadrant of the right breast, could reflect some dermal irritation, 2/87, correlate with visual inspection. No other focal or worrisome chest wall lesions. The osseous structures appear diffusely demineralized which may limit detection of small or nondisplaced fractures. Multilevel degenerative changes are present in the imaged portions of the spine. Additional degenerative changes in the shoulders. No acute or worrisome osseous lesions. IMPRESSION: 1. Complex loculated left pleural effusion extending within the fissures and over the left lung apex. Adjacent areas of passive atelectasis in the left lung base. Fairly uniform enhancement of the underlying, atelectatic lung parenchyma though underlying consolidation or lesion is not fully excluded. 2. Trace right pleural effusion with minimal adjacent atelectasis in the lung right lung. 3. Features of mild interstitial edema including some vascular redistribution interlobular septal thickening. 4. Three-vessel coronary artery atherosclerosis. 5. Heterogeneous multinodular thyroid gland. In the setting of significant comorbidities or limited life  expectancy, no follow-up recommended (ref: J Am Coll Radiol. 2015 Feb;12(2): 143-50). 6. At least 2 small nodules 1 appearing solid in the other sub solid in nature within the right upper lobe. Non-contrast chest CT at 3-6 months is recommended. If nodules persist and are stable at that time, consider additional non-contrast chest CT examinations at 2 and 4 years. This recommendation follows the consensus statement: Guidelines for Management of Incidental Pulmonary Nodules Detected on CT Images: From the Fleischner Society 2017; Radiology 2017; 284:228-243. 7. Some nonspecific focal skin thickening along the lower inner quadrant of the right breast, correlate with visual inspection. 8. Small sliding-type hernia. 9. Aortic Atherosclerosis (ICD10-I70.0). Electronically Signed   By: Lovena Le M.D.   On: 06/17/2020 22:26   DG Chest Portable 1 View  Result Date: 06/17/2020 CLINICAL DATA:  86 year old female with cough, weakness. Fall today. EXAM: PORTABLE CHEST 1 VIEW COMPARISON:  Portable chest 07/28/2010. FINDINGS: Portable AP upright view at 2043 hours. Moderate opacification of the left hemithorax, most resembles a pleural effusion with dense opacification of the lung base. Some of the mediastinal contours are also obscured, and there is increased density at the left hilum. The right lung appears clear allowing for portable technique. No pneumothorax. Right mediastinal contours appear normal. Calcified aortic atherosclerosis. Chronic deviation of the trachea to the right is stable. No acute rib fracture identified. No acute osseous abnormality identified. Paucity of bowel gas in the upper abdomen. IMPRESSION: 1. Opacification of the left lower lung. At least part of this is felt to reflect pleural effusion but underlying consolidation or mass cannot be excluded, and there is suspicious increased density at the left hilum. Chest CT (IV contrast preferred) would best evaluate further. 2. Right lung is clear.   No acute traumatic injury is identified. Electronically Signed   By: Genevie Ann M.D.   On: 06/17/2020 21:24   US THORACENTESIS ASP PLEURAL SPACE W/IMG GUIDE  Result Date: 06/18/2020 INDICATION: Patient with history of dyspnea, cough, loculated left pleural effusion; request received for diagnostic and therapeutic left thoracentesis. EXAM: ULTRASOUND GUIDED DIAGNOSTIC AND THERAPEUTIC LEFT THORACENTESIS MEDICATIONS: 1% lidocaine to skin/SQ tissue COMPLICATIONS: None immediate. PROCEDURE: An ultrasound guided thoracentesis was thoroughly discussed with the patient and questions answered. The benefits, risks, alternatives and complications were also discussed. The patient understands and wishes to proceed with the procedure. Written consent was obtained. Ultrasound was performed to localize and mark an adequate pocket of fluid in the left chest. The area was then prepped and draped in the normal sterile fashion. 1%  Lidocaine was used for local anesthesia. Under ultrasound guidance a 19 gauge, 7-cm, Yueh catheter was introduced. Thoracentesis was performed. The catheter was removed and a dressing applied. FINDINGS: A total of approximately 570 cc of yellow fluid was removed. Samples were sent to the laboratory as requested by the clinical team. IMPRESSION: Successful ultrasound guided diagnostic and therapeutic left thoracentesis yielding 570 cc of pleural fluid. Read by: Rowe Robert, PA-C Electronically Signed   By: Markus Daft M.D.   On: 06/18/2020 12:57    Scheduled Meds: . amLODipine  10 mg Oral Daily  . aspirin  81 mg Oral Daily  . heparin  5,000 Units Subcutaneous Q8H  . losartan  100 mg Oral Daily   And  . hydrochlorothiazide  12.5 mg Oral Daily  . ipratropium  0.5 mg Nebulization BID  . potassium chloride  20 mEq Oral BID   Continuous Infusions: . azithromycin 500 mg (06/19/20 0817)  . cefTRIAXone (ROCEPHIN)  IV Stopped (06/18/20 0819)     LOS: 1 day    Time spent: 30 minutes    Barton Dubois, MD [facility]   To contact the attending provider between 7A-7P or the covering provider during after hours 7P-7A, please log into the web site [email or web address] and access using universal [place] password for that web site. If you do not have the password, please call the hospital operator.  06/19/2020, 12:33 PM

## 2020-06-19 NOTE — Plan of Care (Signed)
  Problem: Acute Rehab PT Goals(only PT should resolve) Goal: Pt Will Go Supine/Side To Sit Outcome: Progressing Flowsheets (Taken 06/19/2020 1506) Pt will go Supine/Side to Sit: with minimal assist Goal: Patient Will Transfer Sit To/From Stand Outcome: Progressing Flowsheets (Taken 06/19/2020 1506) Patient will transfer sit to/from stand:  with minimal assist  with moderate assist Goal: Pt Will Transfer Bed To Chair/Chair To Bed Outcome: Progressing Flowsheets (Taken 06/19/2020 1506) Pt will Transfer Bed to Chair/Chair to Bed: with mod assist Goal: Pt Will Ambulate Outcome: Progressing Flowsheets (Taken 06/19/2020 1506) Pt will Ambulate:  15 feet  with moderate assist  with rolling walker   3:07 PM, 06/19/20 Lonell Grandchild, MPT Physical Therapist with [facility] [phone] office [phone] mobile phone

## 2020-06-19 NOTE — Care Management Important Message (Signed)
Important Message  Patient Details  Name: Whitney Watts MRN: [id number] Date of Birth: 1922/11/28   Medicare Important Message Given:  Yes     Tommy Medal 06/19/2020, 4:25 PM

## 2020-06-19 NOTE — TOC Initial Note (Signed)
Transition of Care [facility]) - Initial/Assessment Note    Patient Details  Name: Whitney Watts MRN: [id number] Date of Birth: [birth year]  Transition of Care [facility]) CM/SW Contact:    Ihor Gully, LCSW Phone Number: 06/19/2020, 4:17 PM  Clinical Narrative:                 Patient from home alone. Plural effusion, hypoxia. Has cane, 2 and 4 wheeled walkers and a lift chair. Uses 4 wheeled walker and cane. Daughter agreeable to SNF (patient deferred to daughter). Referrals sent.    Expected Discharge Plan: Skilled Nursing Facility Barriers to Discharge: Continued Medical Work up   Patient Goals and CMS Choice Patient states their goals for this hospitalization and ongoing recovery are:: rehab and home   Choice offered to / list presented to : Adult Children, Patient  Expected Discharge Plan and Services Expected Discharge Plan: [place] Acute Care Choice: [place] arrangements for the past 2 months: Single Family Home                                      Prior Living Arrangements/Services Living arrangements for the past 2 months: Single Family Home Lives with:: Self Patient language and need for interpreter reviewed:: Yes        Need for Family Participation in Patient Care: Yes (Comment) Care giver support system in place?: Yes (comment) Current home services: DME Criminal Activity/Legal Involvement Pertinent to Current Situation/Hospitalization: No - Comment as needed  Activities of Daily Living Home Assistive Devices/Equipment: Walker (specify type) ADL Screening (condition at time of admission) Patient's cognitive ability adequate to safely complete daily activities?: Yes Is the patient deaf or have difficulty hearing?: No Does the patient have difficulty seeing, even when wearing glasses/contacts?: No Does the patient have difficulty concentrating, remembering, or making decisions?: No Patient able to express  need for assistance with ADLs?: Yes Does the patient have difficulty dressing or bathing?: Yes Independently performs ADLs?: Yes (appropriate for developmental age) Communication: Independent Dressing (OT): Independent Grooming: Independent Feeding: Independent Bathing: Independent Toileting: Independent In/Out Bed: Independent Walks in Home: Independent Does the patient have difficulty walking or climbing stairs?: No Weakness of Legs: Both Weakness of Arms/Hands: Both  Permission Sought/Granted Permission sought to share information with : Family Supports    Share Information with NAME: Isaiah Serge     Permission granted to share info w Relationship: daughter     Emotional Assessment Appearance:: Appears stated age   Affect (typically observed): Appropriate Orientation: : Oriented to Self, Oriented to  Time, Oriented to Situation, Oriented to Place Alcohol / Substance Use: Not Applicable Psych Involvement: No (comment)  Admission diagnosis:  Thyroid nodule [E04.1] Weakness [R53.1] Pleural effusion [J90] Status post thoracentesis [Z98.890] Acute respiratory failure with hypoxia (HCC) [J96.01] Loculated pleural effusion [J90] Fall, initial encounter [W19.XXXA] Patient Active Problem List   Diagnosis Date Noted  . Pleural effusion 06/18/2020  . Acute respiratory failure with hypoxia ([place]) 06/18/2020  . OA (osteoarthritis) of knee 04/17/2013  . Right knee pain 04/17/2013  . DIABETES MELLITUS, TYPE II, CONTROLLED 03/06/2009  . OBESITY 07/18/2008  . MYALGIA 04/02/2008  . ALLERGIC RHINITIS 01/02/2008  . ABNORMAL ELECTROCARDIOGRAM 01/03/2007  . DIABETES MELLITUS, TYPE II, UNCONTROLLED 10/11/2006  . HYPERLIPIDEMIA 10/11/2006  . DYSMETABOLIC SYNDROME 24/23/5361  . POLYCYTHEMIA 10/11/2006  . ESSENTIAL HYPERTENSION 10/11/2006  . ABDOMINAL  INCISIONAL HERNIA 10/11/2006  . CATARACT NOS 10/02/2006  . CONSTIPATION NOS 10/02/2006  . ABSCESS, [place] GLAND 10/02/2006  .  OSTEOARTHRITIS 10/02/2006  . LOW BACK PAIN 10/02/2006   PCP:  Celene Squibb, MD Pharmacy:   [facility], [address] [place] [address] Phone: [phone] Fax: [phone]     Social Determinants of Health (SDOH) Interventions    Readmission Risk Interventions No flowsheet data found.

## 2020-06-20 ENCOUNTER — Inpatient Hospital Stay (HOSPITAL_COMMUNITY): Payer: PPO

## 2020-06-20 DIAGNOSIS — J9601 Acute respiratory failure with hypoxia: Secondary | ICD-10-CM | POA: Diagnosis not present

## 2020-06-20 DIAGNOSIS — R531 Weakness: Secondary | ICD-10-CM | POA: Diagnosis not present

## 2020-06-20 DIAGNOSIS — J90 Pleural effusion, not elsewhere classified: Secondary | ICD-10-CM | POA: Diagnosis not present

## 2020-06-20 DIAGNOSIS — Z9889 Other specified postprocedural states: Secondary | ICD-10-CM | POA: Diagnosis not present

## 2020-06-20 MED ORDER — DM-GUAIFENESIN ER 30-600 MG PO TB12
1.0000 | ORAL_TABLET | Freq: Two times a day (BID) | ORAL | Status: DC
  Administered 2020-06-20 – 2020-06-22 (×5): 1 via ORAL
  Filled 2020-06-20 (×5): qty 1

## 2020-06-20 NOTE — Progress Notes (Addendum)
PROGRESS NOTE    Whitney Watts  QMV:[id number] DOB: [birth year] DOA: 06/17/2020 PCP: Celene Squibb, MD   Chief Complaint  Patient presents with  . Fall    Brief Narrative:  As per H&P written by Dr. Clearence Ped on 06/18/20  Whitney Watts  is a 86 y.o. female, with history of hypertension and diabetes mellitus presents to the ER with chief complaint of weakness and a fall.  Patient reports that she has been so weak over the past few days, and her knees gave out today.  She reports she fell on the ground and could not get up.  She had no presyncopal symptoms prior to her fall.  She has an associated dry cough that is been also going on for couple of days.  She reports that she had left chest wall pain, that she attributed to a pulled muscle, so she has been rubbing IcyHot cream on it.  This treatment has been ineffective.  Patient denies radiation of the pain.  Patient reports that she has had no fevers.  Patient is really a pretty terrible historian.  Although she is oriented to person place and time, she is not oriented to contacts.  She reports that she saw me earlier with the man that drove me here-I drive myself to work-and she asks me to recall things that are in her home, which have obviously never been to her home.  It is unclear how much of her history is accurate.  She does report that when she fell she hit her head.  She reports the rest of her body has so much arthritis she went know if she had it or not.  She does not think she has ever had a pleural effusion before, and she does not think she is ever had a thoracentesis before.  In the ER temperature 99.4, heart rate 121, respiratory rate 24, blood pressure 162/77 White blood cell count 16.6, potassium 3.3, glucose 182 Albumin is 2.7 CT shows a loculated left pleural effusion extending within the fissures and over the left lung apex Chest x-ray shows opacification of left lower lung EKG shows a heart rate of 116, QTc 461  Assessment  & Plan: 1-left lower lobe community-acquired pneumonia with parapneumonic effusion -Thoracentesis demonstrating fluid consistent with exudate; no microorganisms isolated, negative Gram stain. -Patient afebrile and reports improvement in her breathing; but is still requiring nasal cannula supplementation. -Continue flutter valve, nebulizer management and oral antibiotics (Augmentin). -Patient started on Mucinex/guaifenesin to assist with productive coughing spells. -Wean oxygen supplementation as tolerated and assess physical activity capability.  2-Acute respiratory failure with hypoxia (HCC) -Secondary to problem #1 -Continue management as specified above -Continue to wean oxygen supplementation back to room air as tolerated. -No prior history of chronic lung problems or history of using oxygen supplementation before. -Started on flutter valve.  3-hypertension -Continue amlodipine, losartan and HCTZ -Blood pressure stable currently.  4-borderline normal B12 level -Started on daily oral supplementation of vitamin B12  5-hypokalemia in the setting of chronic diuretic usage -Continue daily supplementation. -Follow electrolytes trend intermittently.  6-physical deconditioning -Patient reports feeling weak and deconditioned. -Also expressed complaints of right knee and right hip pain; will check x-ray to rule out any fractures.  But was able to get up and bear weight with PT. -Continue treatment of precipitating cause (problem #1). -Physical therapy has seen patient and recommended a skilled nursing facility for further care and rehabilitation.  Prior to admission patient was able to live home alone,  performed all activities of daily living including cleaning her house and driving.   7-mild protein calorie malnutrition -Positive hypoalbuminemia appreciated on examination -Continue encouraging rich protein food and as needed use feeding supplements.  DVT prophylaxis: Heparin Code  Status: DNR Family Communication: Daughter at bedside updated on 06/19/2020; no family members at bedside during my evaluation today. (Patient expressed that she will update her daughter with details and plan). Disposition:   Status is: Inpatient  Dispo: The patient is from: home              Anticipated d/c is to: to be determined.              Anticipated d/c date is: When bed available at SNF.              Patient currently medically ready for discharge; still feeling slightly short of breath with activity and using 2 L nasal cannula supplementation.  But tolerating antibiotics by mouth, and could easily be discharge with 2 L supplementation on a slow wean off process at facility. Negative Gram stain with no organisms isolated.  Patient will be transition to Augmentin; plan to treat for 7 more days; wean oxygen supplementation as tolerated and following physical therapy evaluation and recommendations will proceed with skilled nursing facility placement for rehab and conditioning.       Consultants:   None    Procedures:  See below for x-ray reports.  Antimicrobials:  Rocephin and Zithromax 10/14>>10/15 augmentin   Subjective: No fever currently; denies chest pain, no nausea, no vomiting.  Patient is still requiring 2 L nasal cannula supplementation, complaining of productive coughing spells and feeling weak and deconditioned.  Expressed pain in her right knee and right hip.  Objective: Vitals:   06/20/20 0456 06/20/20 0456 06/20/20 0745 06/20/20 0919  BP:  (!) 152/69  132/62  Pulse:  99    Resp:  16    Temp:  98.6 F (37 C)    TempSrc:  Oral    SpO2:  91% 91%   Weight: 68.8 kg     Height:        Intake/Output Summary (Last 24 hours) at 06/20/2020 1107 Last data filed at 06/19/2020 2100 Gross per 24 hour  Intake 240 ml  Output 500 ml  Net -260 ml   Filed Weights   06/17/20 1928 06/18/20 2248 06/20/20 0456  Weight: 74.8 kg 67 kg 68.8 kg     Examination: General exam: Alert, awake, oriented x 3; reports feeling better today; still weak, deconditioned, complaining of right knee and right hip pain and also still requiring 2 L nasal cannula supplementation with complaints of intermittent productive coughing spells. Respiratory system: No wheezing on auscultation currently; no using accessory muscles.  Positive bilateral rhonchi. Cardiovascular system:RRR. No murmurs, rubs, gallops.  No JVD. Gastrointestinal system: Abdomen is nondistended, soft and nontender. No organomegaly or masses felt. Normal bowel sounds heard. Central nervous system: Alert and oriented. No focal neurological deficits. Extremities: No cyanosis or clubbing. Skin: No petechiae Psychiatry: Judgement and insight appear normal. Mood & affect appropriate.    Data Reviewed: I have personally reviewed following labs and imaging studies  CBC: Recent Labs  Lab 06/17/20 2038 06/18/20 0622  WBC 16.6* 14.3*  NEUTROABS 13.8* 12.2*  HGB 11.7* 11.7*  HCT 34.4* 34.6*  MCV 88.2 87.8  PLT 448* 462*    Basic Metabolic Panel: Recent Labs  Lab 06/17/20 2038 06/18/20 0622  NA 132* 136  K 3.3* 3.4*  CL 98 100  CO2 22 24  GLUCOSE 182* 138*  BUN 13 11  CREATININE 0.47 0.41*  CALCIUM 8.7* 8.8*  MG  --  1.8    GFR: Estimated Creatinine Clearance: 39.4 mL/min (A) (by C-G formula based on SCr of 0.41 mg/dL (L)).  Liver Function Tests: Recent Labs  Lab 06/17/20 2038 06/18/20 0622  AST 13* 11*  ALT 12 12  ALKPHOS 121 113  BILITOT 0.8 0.9  PROT 6.7 6.3*  ALBUMIN 2.7* 2.5*    CBG: No results for input(s): GLUCAP in the last 168 hours.   Recent Results (from the past 240 hour(s))  Respiratory Panel by RT PCR (Flu A&B, Covid) - Nasopharyngeal Swab     Status: None   Collection Time: 06/17/20  7:58 PM   Specimen: Nasopharyngeal Swab  Result Value Ref Range Status   SARS Coronavirus 2 by RT PCR NEGATIVE NEGATIVE Final    Comment: (NOTE) SARS-CoV-2  target nucleic acids are NOT DETECTED.  The SARS-CoV-2 RNA is generally detectable in upper respiratoy specimens during the acute phase of infection. The lowest concentration of SARS-CoV-2 viral copies this assay can detect is 131 copies/mL. A negative result does not preclude SARS-Cov-2 infection and should not be used as the sole basis for treatment or other patient management decisions. A negative result may occur with  improper specimen collection/handling, submission of specimen other than nasopharyngeal swab, presence of viral mutation(s) within the areas targeted by this assay, and inadequate number of viral copies (<131 copies/mL). A negative result must be combined with clinical observations, patient history, and epidemiological information. The expected result is Negative.  Fact Sheet for Patients:  PinkCheek.be  Fact Sheet for Healthcare Providers:  GravelBags.it  This test is no t yet approved or cleared by the [place] FDA and  has been authorized for detection and/or diagnosis of SARS-CoV-2 by FDA under an Emergency Use Authorization (EUA). This EUA will remain  in effect (meaning this test can be used) for the duration of the COVID-19 declaration under Section 564(b)(1) of the Act, 21 U.S.C. section 360bbb-3(b)(1), unless the authorization is terminated or revoked sooner.     Influenza A by PCR NEGATIVE NEGATIVE Final   Influenza B by PCR NEGATIVE NEGATIVE Final    Comment: (NOTE) The Xpert Xpress SARS-CoV-2/FLU/RSV assay is intended as an aid in  the diagnosis of influenza from Nasopharyngeal swab specimens and  should not be used as a sole basis for treatment. Nasal washings and  aspirates are unacceptable for Xpert Xpress SARS-CoV-2/FLU/RSV  testing.  Fact Sheet for Patients: PinkCheek.be  Fact Sheet for Healthcare  Providers: GravelBags.it  This test is not yet approved or cleared by the [place] FDA and  has been authorized for detection and/or diagnosis of SARS-CoV-2 by  FDA under an Emergency Use Authorization (EUA). This EUA will remain  in effect (meaning this test can be used) for the duration of the  Covid-19 declaration under Section 564(b)(1) of the Act, 21  U.S.C. section 360bbb-3(b)(1), unless the authorization is  terminated or revoked. Performed at [facility], [address] [place], [place] [address]   Gram stain     Status: None   Collection Time: 06/18/20 12:34 PM   Specimen: Pleura; Body Fluid  Result Value Ref Range Status   Specimen Description PLEURAL  Final   Special Requests BOTTLES DRAWN AEROBIC AND ANAEROBIC 10CC  Final   Gram Stain   Final    NO ORGANISMS SEEN WBC PRESENT,BOTH PMN AND  MONONUCLEAR Performed at [facility], [address] [place], [place] [address]    Report Status 06/18/2020 FINAL  Final  Culture, body fluid-bottle     Status: None (Preliminary result)   Collection Time: 06/18/20 12:34 PM   Specimen: Pleura  Result Value Ref Range Status   Specimen Description PLEURAL  Final   Special Requests 10CC  Final   Culture   Final    NO GROWTH 2 DAYS Performed at [facility], [address], [place], [place] [address]    Report Status PENDING  Incomplete     Radiology Studies: DG Chest 1 View  Result Date: 06/18/2020 CLINICAL DATA:  Status post thoracentesis. EXAM: CHEST  1 VIEW COMPARISON:  Single-view of the chest and CT chest 06/17/2020. FINDINGS: Left pleural effusion is decreased after thoracentesis. No pneumothorax. Focal opacity in the left upper lung zone is presumably due to residual atelectasis. Right lung is clear. Cardiomegaly noted. IMPRESSION: Decreased left pleural effusion after thoracentesis. No pneumothorax. Electronically Signed   By: Inge Rise M.D.   On: 06/18/2020 13:12   US THORACENTESIS  ASP PLEURAL SPACE W/IMG GUIDE  Result Date: 06/18/2020 INDICATION: Patient with history of dyspnea, cough, loculated left pleural effusion; request received for diagnostic and therapeutic left thoracentesis. EXAM: ULTRASOUND GUIDED DIAGNOSTIC AND THERAPEUTIC LEFT THORACENTESIS MEDICATIONS: 1% lidocaine to skin/SQ tissue COMPLICATIONS: None immediate. PROCEDURE: An ultrasound guided thoracentesis was thoroughly discussed with the patient and questions answered. The benefits, risks, alternatives and complications were also discussed. The patient understands and wishes to proceed with the procedure. Written consent was obtained. Ultrasound was performed to localize and mark an adequate pocket of fluid in the left chest. The area was then prepped and draped in the normal sterile fashion. 1% Lidocaine was used for local anesthesia. Under ultrasound guidance a 19 gauge, 7-cm, Yueh catheter was introduced. Thoracentesis was performed. The catheter was removed and a dressing applied. FINDINGS: A total of approximately 570 cc of yellow fluid was removed. Samples were sent to the laboratory as requested by the clinical team. IMPRESSION: Successful ultrasound guided diagnostic and therapeutic left thoracentesis yielding 570 cc of pleural fluid. Read by: Rowe Robert, PA-C Electronically Signed   By: Markus Daft M.D.   On: 06/18/2020 12:57    Scheduled Meds: . amLODipine  10 mg Oral Daily  . amoxicillin-clavulanate  1 tablet Oral Q12H  . aspirin  81 mg Oral Daily  . heparin  5,000 Units Subcutaneous Q8H  . losartan  100 mg Oral Daily   And  . hydrochlorothiazide  12.5 mg Oral Daily  . ipratropium  0.5 mg Nebulization BID  . potassium chloride  20 mEq Oral BID  . vitamin B-12  1,000 mcg Oral Daily   Continuous Infusions:    LOS: 2 days    Time spent: 30 minutes    Barton Dubois, MD [facility]   To contact the attending provider between 7A-7P or the covering provider during after hours  7P-7A, please log into the web site [email or web address] and access using universal [place] password for that web site. If you do not have the password, please call the hospital operator.  06/20/2020, 11:07 AM

## 2020-06-20 NOTE — Progress Notes (Signed)
   06/19/20 2115  Assess: MEWS Score  Temp 99 F (37.2 C)  BP 124/67  Pulse Rate (!) 113  Resp 18  SpO2 96 %  Assess: MEWS Score  MEWS Temp 0  MEWS Systolic 0  MEWS Pulse 2  MEWS RR 0  MEWS LOC 0  MEWS Score 2  MEWS Score Color Yellow  Assess: if the MEWS score is Yellow or Red  Were vital signs taken at a resting state? Yes  Focused Assessment No change from prior assessment  Early Detection of Sepsis Score *See Row Information* Low  MEWS guidelines implemented *See Row Information* No, other (Comment) (vitals rechecked)

## 2020-06-21 DIAGNOSIS — J9601 Acute respiratory failure with hypoxia: Secondary | ICD-10-CM | POA: Diagnosis not present

## 2020-06-21 DIAGNOSIS — R531 Weakness: Secondary | ICD-10-CM | POA: Diagnosis not present

## 2020-06-21 DIAGNOSIS — Z9889 Other specified postprocedural states: Secondary | ICD-10-CM | POA: Diagnosis not present

## 2020-06-21 DIAGNOSIS — J90 Pleural effusion, not elsewhere classified: Secondary | ICD-10-CM | POA: Diagnosis not present

## 2020-06-21 LAB — CBC
HCT: 30.1 % — ABNORMAL LOW (ref 36.0–46.0)
Hemoglobin: 9.9 g/dL — ABNORMAL LOW (ref 12.0–15.0)
MCH: 29.5 pg (ref 26.0–34.0)
MCHC: 32.9 g/dL (ref 30.0–36.0)
MCV: 89.6 fL (ref 80.0–100.0)
Platelets: 432 10*3/uL — ABNORMAL HIGH (ref 150–400)
RBC: 3.36 MIL/uL — ABNORMAL LOW (ref 3.87–5.11)
RDW: 12.7 % (ref 11.5–15.5)
WBC: 14.3 10*3/uL — ABNORMAL HIGH (ref 4.0–10.5)
nRBC: 0 % (ref 0.0–0.2)

## 2020-06-21 LAB — BASIC METABOLIC PANEL
Anion gap: 6 (ref 5–15)
BUN: 9 mg/dL (ref 8–23)
CO2: 26 mmol/L (ref 22–32)
Calcium: 8.3 mg/dL — ABNORMAL LOW (ref 8.9–10.3)
Chloride: 98 mmol/L (ref 98–111)
Creatinine, Ser: 0.41 mg/dL — ABNORMAL LOW (ref 0.44–1.00)
GFR, Estimated: >60 mL/min (ref >60)
Glucose, Bld: 160 mg/dL — ABNORMAL HIGH (ref 70–99)
Potassium: 4 mmol/L (ref 3.5–5.1)
Sodium: 130 mmol/L — ABNORMAL LOW (ref 135–145)

## 2020-06-21 NOTE — Progress Notes (Signed)
PROGRESS NOTE    Whitney Watts  JGO:[id number] DOB: 03/03/1923 DOA: 06/17/2020 PCP: Celene Squibb, MD   Chief Complaint  Patient presents with  . Fall    Brief Narrative:  As per H&P written by Dr. Clearence Ped on 06/18/20  Whitney Watts  is a 86 y.o. female, with history of hypertension and diabetes mellitus presents to the ER with chief complaint of weakness and a fall.  Patient reports that she has been so weak over the past few days, and her knees gave out today.  She reports she fell on the ground and could not get up.  She had no presyncopal symptoms prior to her fall.  She has an associated dry cough that is been also going on for couple of days.  She reports that she had left chest wall pain, that she attributed to a pulled muscle, so she has been rubbing IcyHot cream on it.  This treatment has been ineffective.  Patient denies radiation of the pain.  Patient reports that she has had no fevers.  Patient is really a pretty terrible historian.  Although she is oriented to person place and time, she is not oriented to contacts.  She reports that she saw me earlier with the man that drove me here-I drive myself to work-and she asks me to recall things that are in her home, which have obviously never been to her home.  It is unclear how much of her history is accurate.  She does report that when she fell she hit her head.  She reports the rest of her body has so much arthritis she went know if she had it or not.  She does not think she has ever had a pleural effusion before, and she does not think she is ever had a thoracentesis before.  In the ER temperature 99.4, heart rate 121, respiratory rate 24, blood pressure 162/77 White blood cell count 16.6, potassium 3.3, glucose 182 Albumin is 2.7 CT shows a loculated left pleural effusion extending within the fissures and over the left lung apex Chest x-ray shows opacification of left lower lung EKG shows a heart rate of 116, QTc 461  Assessment  & Plan: 1-left lower lobe community-acquired pneumonia with parapneumonic effusion -Thoracentesis demonstrating fluid consistent with exudate; no microorganisms isolated, negative Gram stain. -Patient afebrile and continue experiencing improvement in her breathing; but is still requiring nasal cannula supplementation (2 L nasal cannula). -Continue the use of flutter valve, nebulizer management and oral antibiotics (Augmentin). -Continue Mucinex/guaifenesin to assist with productive coughing spells. -Continue weaning off oxygen supplementation as tolerated  -Physical therapy has seen patient and is recommending a skilled nursing facility for rehabilitation and care.    2-Acute respiratory failure with hypoxia (HCC) -Secondary to problem #1 -Continue management as specified above. -Continue to wean oxygen supplementation back to room air as tolerated. -No prior history of chronic lung problems or history of using oxygen supplementation before. -Continue the use of flutter valve.  3-hypertension -Continue amlodipine, losartan and HCTZ -Blood pressure stable currently. -Follow vital signs and adjust antihypertensive regimen as required.  4-borderline normal B12 level -Continue oral supplementation/maintenance B12 dose.  5-hypokalemia in the setting of chronic diuretic usage -Continue daily supplementation. -Follow electrolytes trend intermittently. -Most recent labs demonstrated electrolytes within normal limits.  We will continue to follow trend intermittently  6-physical deconditioning -Patient reports feeling weak and deconditioned. -Also expressed complaints of right knee and right hip pain; will check x-ray to rule out any fractures.  But was able to get up and bear weight with PT. -Continue treatment of precipitating cause (problem #1). -Physical therapy has seen patient and recommended a skilled nursing facility for further care and rehabilitation.  Prior to admission patient was  able to live home alone, performed all activities of daily living including cleaning her house and driving.   7-mild protein calorie malnutrition -Positive hypoalbuminemia appreciated on examination -Continue encouraging rich protein food and as needed use feeding supplements.  8-right knee/right hip pain -Patient is status post fall -No radiologic evidence of acute fracture -Changes of osteopenia appreciated -Chronic severe 3 compartmental degenerative changes and calcifications appreciated on her knee. -Continue as needed analgesics and weightbearing as tolerated.  DVT prophylaxis: Heparin Code Status: DNR Family Communication: Daughter updated at bedside on 06/21/2020 Disposition:   Status is: Inpatient  Dispo: The patient is from: home              Anticipated d/c is to: to be determined.              Anticipated d/c date is: When bed available at SNF.              Patient currently medically ready for discharge; still feeling slightly short of breath with activity and using 2 L nasal cannula supplementation;  But tolerating antibiotics by mouth, and could easily be discharge with 2 L supplementation on a slow wean off process at facility. Negative Gram stain with no organisms isolated.  Patient will continue oral Augmentin to finish antibiotic therapy; plan to treat for 6 more days.  Awaiting insurance authorization for skilled nursing facility discharge.    Consultants:   None    Procedures:  See below for x-ray reports.  Antimicrobials:  Rocephin and Zithromax 10/14>>10/15 augmentin   Subjective: Using 2 L nasal cannula supplementation; mild intermittent productive coughing spells.  No using accessory muscle.  Denies chest pain, no nausea, no vomiting, tolerating diet, oral antibiotics and feeling much better overall.  Objective: Vitals:   06/20/20 2101 06/21/20 0556 06/21/20 0749 06/21/20 0910  BP: (!) 130/53 130/71  115/67  Pulse: (!) 102 94    Resp: 18 18     Temp: 98 F (36.7 C) 98.3 F (36.8 C)    TempSrc: Oral Oral    SpO2: 93% 94% 94%   Weight:  68.7 kg    Height:        Intake/Output Summary (Last 24 hours) at 06/21/2020 1416 Last data filed at 06/21/2020 0636 Gross per 24 hour  Intake --  Output 750 ml  Net -750 ml   Filed Weights   06/18/20 2248 06/20/20 0456 06/21/20 0556  Weight: 67 kg 68.8 kg 68.7 kg    Examination: General exam: Alert, awake, oriented x 3; reports continued improvement in her breathing.  No fever, no nausea, no vomiting, no chest pain, no palpitations. Respiratory system: No wheezing currently; positive scattered rhonchi at, no using accessory muscles. Cardiovascular system:RRR. No murmurs, rubs, gallops.  No JVD. Gastrointestinal system: Abdomen is nondistended, soft and nontender. No organomegaly or masses felt. Normal bowel sounds heard. Central nervous system: Alert and oriented. No focal neurological deficits. Extremities: No cyanosis or clubbing. Skin: No rashes, no petechiae. Psychiatry: Judgement and insight appear normal. Mood & affect appropriate.    Data Reviewed: I have personally reviewed following labs and imaging studies  CBC: Recent Labs  Lab 06/17/20 2038 06/18/20 0622 06/21/20 0615  WBC 16.6* 14.3* 14.3*  NEUTROABS 13.8* 12.2*  --  HGB 11.7* 11.7* 9.9*  HCT 34.4* 34.6* 30.1*  MCV 88.2 87.8 89.6  PLT 448* 462* 432*    Basic Metabolic Panel: Recent Labs  Lab 06/17/20 2038 06/18/20 0622 06/21/20 0615  NA 132* 136 130*  K 3.3* 3.4* 4.0  CL 98 100 98  CO2 22 24 26   GLUCOSE 182* 138* 160*  BUN 13 11 9   CREATININE 0.47 0.41* 0.41*  CALCIUM 8.7* 8.8* 8.3*  MG  --  1.8  --     GFR: Estimated Creatinine Clearance: 39.4 mL/min (A) (by C-G formula based on SCr of 0.41 mg/dL (L)).  Liver Function Tests: Recent Labs  Lab 06/17/20 2038 06/18/20 0622  AST 13* 11*  ALT 12 12  ALKPHOS 121 113  BILITOT 0.8 0.9  PROT 6.7 6.3*  ALBUMIN 2.7* 2.5*    CBG: No  results for input(s): GLUCAP in the last 168 hours.   Recent Results (from the past 240 hour(s))  Respiratory Panel by RT PCR (Flu A&B, Covid) - Nasopharyngeal Swab     Status: None   Collection Time: 06/17/20  7:58 PM   Specimen: Nasopharyngeal Swab  Result Value Ref Range Status   SARS Coronavirus 2 by RT PCR NEGATIVE NEGATIVE Final    Comment: (NOTE) SARS-CoV-2 target nucleic acids are NOT DETECTED.  The SARS-CoV-2 RNA is generally detectable in upper respiratoy specimens during the acute phase of infection. The lowest concentration of SARS-CoV-2 viral copies this assay can detect is 131 copies/mL. A negative result does not preclude SARS-Cov-2 infection and should not be used as the sole basis for treatment or other patient management decisions. A negative result may occur with  improper specimen collection/handling, submission of specimen other than nasopharyngeal swab, presence of viral mutation(s) within the areas targeted by this assay, and inadequate number of viral copies (<131 copies/mL). A negative result must be combined with clinical observations, patient history, and epidemiological information. The expected result is Negative.  Fact Sheet for Patients:  PinkCheek.be  Fact Sheet for Healthcare Providers:  GravelBags.it  This test is no t yet approved or cleared by the [place] FDA and  has been authorized for detection and/or diagnosis of SARS-CoV-2 by FDA under an Emergency Use Authorization (EUA). This EUA will remain  in effect (meaning this test can be used) for the duration of the COVID-19 declaration under Section 564(b)(1) of the Act, 21 U.S.C. section 360bbb-3(b)(1), unless the authorization is terminated or revoked sooner.     Influenza A by PCR NEGATIVE NEGATIVE Final   Influenza B by PCR NEGATIVE NEGATIVE Final    Comment: (NOTE) The Xpert Xpress SARS-CoV-2/FLU/RSV assay is intended as  an aid in  the diagnosis of influenza from Nasopharyngeal swab specimens and  should not be used as a sole basis for treatment. Nasal washings and  aspirates are unacceptable for Xpert Xpress SARS-CoV-2/FLU/RSV  testing.  Fact Sheet for Patients: PinkCheek.be  Fact Sheet for Healthcare Providers: GravelBags.it  This test is not yet approved or cleared by the [place] FDA and  has been authorized for detection and/or diagnosis of SARS-CoV-2 by  FDA under an Emergency Use Authorization (EUA). This EUA will remain  in effect (meaning this test can be used) for the duration of the  Covid-19 declaration under Section 564(b)(1) of the Act, 21  U.S.C. section 360bbb-3(b)(1), unless the authorization is  terminated or revoked. Performed at [facility], [address] [place], [place] [address]   Gram stain     Status: None  Collection Time: 06/18/20 12:34 PM   Specimen: Pleura; Body Fluid  Result Value Ref Range Status   Specimen Description PLEURAL  Final   Special Requests BOTTLES DRAWN AEROBIC AND ANAEROBIC 10CC  Final   Gram Stain   Final    NO ORGANISMS SEEN WBC PRESENT,BOTH PMN AND MONONUCLEAR Performed at [facility], [address], [place], [place] [address]    Report Status 06/18/2020 FINAL  Final  Culture, body fluid-bottle     Status: None (Preliminary result)   Collection Time: 06/18/20 12:34 PM   Specimen: Pleura  Result Value Ref Range Status   Specimen Description PLEURAL  Final   Special Requests 10CC  Final   Culture   Final    NO GROWTH 2 DAYS Performed at [facility], [address], [place], [place] [address]    Report Status PENDING  Incomplete     Radiology Studies: DG Knee Complete 4 Views Right  Result Date: 06/20/2020 CLINICAL DATA:  86 year old female with history of fall. EXAM: RIGHT KNEE - COMPLETE 4+ VIEW COMPARISON:  04/17/2013 FINDINGS: No acute fracture or malalignment. Severe  lateral compartment and moderate medial compartment joint space narrowing. There is associated subchondral sclerosis. There is periarticular osteophyte formation, most prominent about the lateral proximal tibia. Osteopenia. Large joint effusion. Scattered atherosclerotic calcifications. IMPRESSION: 1. Large knee joint effusion.  No acute fracture or malalignment. 2. Severe tricompartmental degenerative changes. 3. Atherosclerotic calcifications. Electronically Signed   By: Ruthann Cancer MD   On: 06/20/2020 13:11   DG HIP UNILAT WITH PELVIS 2-3 VIEWS RIGHT  Result Date: 06/20/2020 CLINICAL DATA:  86 year old female with history of fall. EXAM: DG HIP (WITH OR WITHOUT PELVIS) 2-3V RIGHT COMPARISON:  None. FINDINGS: There is no evidence of hip fracture or dislocation. Punctate loose body in the lateral supra trochanteric joint space. There is no evidence of significant arthropathy or other focal bone abnormality. Diffuse osteopenia. Limited evaluation of the superior pelvis, sacrum, lower lumbar spine due to overlying bowel gas and stool. Severe appearing discogenic degenerative changes of the lower lumbar spine. IMPRESSION: No radiographic evidence of acute fracture. If right hip pain persistent clinically indicated, MRI hip is recommended for evaluation of possible radiographically occult fracture in this osteopenic patient. Electronically Signed   By: Ruthann Cancer MD   On: 06/20/2020 13:09    Scheduled Meds: . amLODipine  10 mg Oral Daily  . amoxicillin-clavulanate  1 tablet Oral Q12H  . aspirin  81 mg Oral Daily  . dextromethorphan-guaiFENesin  1 tablet Oral BID  . heparin  5,000 Units Subcutaneous Q8H  . losartan  100 mg Oral Daily   And  . hydrochlorothiazide  12.5 mg Oral Daily  . ipratropium  0.5 mg Nebulization BID  . potassium chloride  20 mEq Oral BID  . vitamin B-12  1,000 mcg Oral Daily   Continuous Infusions:    LOS: 3 days    Time spent: 30 minutes    Barton Dubois,  MD [facility]   To contact the attending provider between 7A-7P or the covering provider during after hours 7P-7A, please log into the web site [email or web address] and access using universal [place] password for that web site. If you do not have the password, please call the hospital operator.  06/21/2020, 2:16 PM

## 2020-06-22 DIAGNOSIS — E441 Mild protein-calorie malnutrition: Secondary | ICD-10-CM | POA: Diagnosis not present

## 2020-06-22 DIAGNOSIS — R5381 Other malaise: Secondary | ICD-10-CM | POA: Diagnosis not present

## 2020-06-22 DIAGNOSIS — E1122 Type 2 diabetes mellitus with diabetic chronic kidney disease: Secondary | ICD-10-CM | POA: Diagnosis not present

## 2020-06-22 DIAGNOSIS — J90 Pleural effusion, not elsewhere classified: Secondary | ICD-10-CM | POA: Diagnosis not present

## 2020-06-22 DIAGNOSIS — N390 Urinary tract infection, site not specified: Secondary | ICD-10-CM | POA: Diagnosis not present

## 2020-06-22 DIAGNOSIS — J189 Pneumonia, unspecified organism: Secondary | ICD-10-CM

## 2020-06-22 DIAGNOSIS — R531 Weakness: Secondary | ICD-10-CM

## 2020-06-22 DIAGNOSIS — N1832 Chronic kidney disease, stage 3b: Secondary | ICD-10-CM | POA: Diagnosis not present

## 2020-06-22 DIAGNOSIS — Z9889 Other specified postprocedural states: Secondary | ICD-10-CM

## 2020-06-22 DIAGNOSIS — I10 Essential (primary) hypertension: Secondary | ICD-10-CM | POA: Diagnosis not present

## 2020-06-22 DIAGNOSIS — E041 Nontoxic single thyroid nodule: Secondary | ICD-10-CM | POA: Diagnosis not present

## 2020-06-22 DIAGNOSIS — N184 Chronic kidney disease, stage 4 (severe): Secondary | ICD-10-CM | POA: Diagnosis not present

## 2020-06-22 DIAGNOSIS — E109 Type 1 diabetes mellitus without complications: Secondary | ICD-10-CM | POA: Diagnosis not present

## 2020-06-22 DIAGNOSIS — E119 Type 2 diabetes mellitus without complications: Secondary | ICD-10-CM | POA: Diagnosis not present

## 2020-06-22 DIAGNOSIS — M6281 Muscle weakness (generalized): Secondary | ICD-10-CM | POA: Diagnosis not present

## 2020-06-22 DIAGNOSIS — D509 Iron deficiency anemia, unspecified: Secondary | ICD-10-CM | POA: Diagnosis not present

## 2020-06-22 DIAGNOSIS — R279 Unspecified lack of coordination: Secondary | ICD-10-CM | POA: Diagnosis not present

## 2020-06-22 DIAGNOSIS — R2681 Unsteadiness on feet: Secondary | ICD-10-CM | POA: Diagnosis not present

## 2020-06-22 DIAGNOSIS — R059 Cough, unspecified: Secondary | ICD-10-CM | POA: Diagnosis not present

## 2020-06-22 DIAGNOSIS — J9601 Acute respiratory failure with hypoxia: Secondary | ICD-10-CM | POA: Diagnosis not present

## 2020-06-22 DIAGNOSIS — Z743 Need for continuous supervision: Secondary | ICD-10-CM | POA: Diagnosis not present

## 2020-06-22 DIAGNOSIS — R278 Other lack of coordination: Secondary | ICD-10-CM | POA: Diagnosis not present

## 2020-06-22 LAB — SARS CORONAVIRUS 2 BY RT PCR (HOSPITAL ORDER, PERFORMED IN ~~LOC~~ HOSPITAL LAB): SARS Coronavirus 2: NEGATIVE

## 2020-06-22 MED ORDER — POTASSIUM CHLORIDE 20 MEQ PO PACK: 40.0000 meq | PACK | Freq: Every day | ORAL | Status: DC

## 2020-06-22 MED ORDER — AMOXICILLIN-POT CLAVULANATE 875-125 MG PO TABS: 1.0000 | ORAL_TABLET | Freq: Two times a day (BID) | ORAL | Status: AC

## 2020-06-22 MED ORDER — DM-GUAIFENESIN ER 30-600 MG PO TB12: 1.0000 | ORAL_TABLET | Freq: Two times a day (BID) | ORAL | Status: AC

## 2020-06-22 NOTE — Care Management Important Message (Signed)
Important Message  Patient Details  Name: Whitney Watts MRN: [id number] Date of Birth: 18-Aug-1923   Medicare Important Message Given:  Yes     Tommy Medal 06/22/2020, 3:35 PM

## 2020-06-22 NOTE — Progress Notes (Signed)
Report called to Kerrie Pleasure at [facility]

## 2020-06-22 NOTE — TOC Transition Note (Signed)
Transition of Care [facility]) - CM/SW Discharge Note  Patient Details  Name: Whitney Watts MRN: [id number] Date of Birth: May 18, 1923  Transition of Care [facility]) CM/SW Contact:  Sherie Don, LCSW Phone Number: 06/22/2020, 1:46 PM  Clinical Narrative: Patient is medically ready for discharge. CSW confirmed with Crystal at [facility] that insurance authorization has been received: [place] for SNF and auth [id number] for ambulance transport. CSW called Debbie with [employer]. Per Jackelyn Poling, patient can discharge today. FL2, therapy notes, discharge orders, and discharge summary faxed to [facility]. COVID test result is negative. EMS scheduled and medical necessity form completed. Patient will go to room B19 bed 1 and the number to call for report is [phone]. TOC signing off.  Final next level of care: [place] Barriers to Discharge: Barriers Resolved  Patient Goals and CMS Choice Patient states their goals for this hospitalization and ongoing recovery are:: Discharge to [facility].gov Compare Post Acute Care list provided to:: Patient Choice offered to / list presented to : Patient, Adult Children  Discharge Placement PASRR number recieved: 06/19/20        Patient chooses bed at: [facility] Patient to be transferred to facility by: [place] Name of family member notified: Lelani Garnett (son) Patient and family notified of of transfer: 06/22/20  Discharge Plan and Services Post Acute Care Choice: [place] Arranged: NA [facility] Agency: NA  Readmission Risk Interventions No flowsheet data found.

## 2020-06-22 NOTE — Discharge Summary (Addendum)
Physician Discharge Summary  Whitney Watts UUV:[id number] DOB: [birth year] DOA: 06/17/2020  PCP: Celene Squibb, MD  Admit date: 06/17/2020 Discharge date: 06/22/2020  Time spent: 35 minutes  Recommendations for Outpatient Follow-up:  Maintain adequate hydration Continue to wean oxygen supplementation as tolerated to room air Repeat basic metabolic panel in 5 days to follow-up lites renal function Complete oral antibiotics as instructed make sure to take around food to minimize GI upset symptoms (5 more days at discharge). Physical rehabilitation and conditioning as per skilled nursing facility protocol Repeat chest x-ray in 4-6 weeks to assure complete resolution of infiltrates and not recurrence of pleural effusion.    Discharge Diagnoses:  Active Problems:   Type 2 diabetes mellitus with stage 3b chronic kidney disease, without long-term current use of insulin (HCC)   Loculated pleural effusion   Acute respiratory failure with hypoxia (HCC)   Status post thoracentesis   Thyroid nodule   Weakness   Community acquired pneumonia of left lower lobe of lung   Discharge Condition: Stable and improved.  Discharged to skilled nursing facility for further care and rehabilitation. Discharge on 2L [place] with intentions to continue weaning her off oxygen at SNF.  CODE STATUS: DNR  Diet recommendation: Heart healthy modified carbohydrate.  Filed Weights   06/20/20 0456 06/21/20 0556 06/22/20 0531  Weight: 68.8 kg 68.7 kg 69.5 kg    History of present illness:  As per H&P written by Dr. Clearence Ped on 06/18/20  [name] a86 y.o.female,with history of hypertension and diabetes mellitus presents to the ER with chief complaint of weakness and a fall. Patient reports that she has been so weak over the past few days, and her knees gave out today. She reports she fell on the ground and could not get up. She had no presyncopal symptoms prior to her fall. She has an associated dry  cough that is been also going on for couple of days. She reports that she had left chest wall pain, that she attributed to a pulled muscle, so she has been rubbing IcyHot cream on it. This treatment has been ineffective. Patient denies radiation of the pain. Patient reports that she has had no fevers. Patient is really a pretty terrible historian. Although she is oriented to person place and time, she is not oriented to contacts. She reports that she saw me earlier with the man that drove me here-I drive myself to work-and she asks me to recall things that are in her home, which have obviously never been to her home. It is unclear how much of her history is accurate. She does report that when she fell she hit her head. She reports the rest of her body has so much arthritis she went know if she had it or not. She does not think she has ever had a pleural effusion before, and she does not think she is ever had a thoracentesis before.  In the ER temperature 99.4, heart rate 121, respiratory rate 24, blood pressure 162/77 White blood cell count 16.6, potassium 3.3, glucose 182 Albumin is 2.7 CT shows a loculated left pleural effusion extending within the fissures and over the left lung apex Chest x-ray shows opacification of left lower lung EKG shows a heart rate of 116, QTc 461  Hospital Course:  1-left lower lobe community-acquired pneumonia with parapneumonic effusion -Thoracentesis demonstrating fluid consistent with exudate; no microorganisms isolated, negative Gram stain. -Patient afebrile and continue experiencing improvement in her breathing; but is still requiring nasal cannula supplementation (  2 L nasal cannula mainly with activity). -Continue the use of flutter valve, nebulizer management and oral antibiotics (Augmentin). -Continue Mucinex/guaifenesin to assist with productive coughing spells. -Continue weaning off oxygen supplementation as tolerated  -Physical therapy has seen  patient and is recommending a skilled nursing facility for rehabilitation and care.    2-Acute respiratory failure with hypoxia (HCC) -Secondary to problem #1 -Continue management as specified above. -Continue to wean oxygen supplementation back to room air as tolerated. -No prior history of chronic lung problems or history of using oxygen supplementation before. -Continue the use of flutter valve. -Still using 2 L of supplementation through nasal cannula especially with activity.  3-hypertension -Continue amlodipine, losartan and HCTZ -Blood pressure stable currently. -Follow vital signs and adjust antihypertensive regimen as required.  4-borderline normal B12 level -Patient instructed to take oral B12 supplementation.  5-hypokalemia in the setting of chronic diuretic usage -Continue daily supplementation. -Follow electrolytes trend intermittently. -Most recent labs demonstrated electrolytes within normal limits.  We will continue to follow trend intermittently  6-physical deconditioning -Patient reports feeling weak and deconditioned. -Also expressed complaints of right knee and right hip pain; will check x-ray to rule out any fractures.  But was able to get up and bear weight with PT. -Physical therapy has seen patient and recommended a skilled nursing facility for further care and rehabilitation.  Prior to admission patient was able to live home alone, performed all activities of daily living including cleaning her house and driving.   7-mild protein calorie malnutrition -Positive hypoalbuminemia appreciated on examination -Continue encouraging rich protein food and as needed use feeding supplements.  8-right knee/right hip pain -Patient is status post fall -No radiologic evidence of acute fracture -Changes of osteopenia appreciated -Chronic severe 3 compartmental degenerative changes and calcifications appreciated on her knee. -Continue as needed analgesics and  weightbearing as tolerated.  9-type 2 diabetes mellitus -Continue the use of Metformin twice a day -Modified carbohydrate diet recommended.  Procedures: See below for x-ray reports  Consultations:  None  Discharge Exam: Vitals:   06/22/20 0531 06/22/20 0755  BP: 123/66   Pulse: 90   Resp: 20   Temp: 99.2 F (37.3 C)   SpO2: 93% 91%    General: No chest pain, no nausea, no vomiting.  Still feeling slightly short of breath with activity and experiencing intermittent coughing spells.  2 L nasal cannula supplementation in place; in no acute distress. Cardiovascular: S1 and S2, no rubs, no gallops, no JVD on exam. Respiratory: Positive scattered rhonchi; no using accessory muscles, no appreciated tachypnea and normal respiratory effort.  Good O2 sat on 2 L nasal cannula supplementation. Abdomen: Soft, nontender, distended, positive bowel sounds Extremities: No cyanosis or clubbing.  Discharge Instructions   Discharge Instructions    Diet - low sodium heart healthy   Complete by: As directed    Discharge instructions   Complete by: As directed    Take medications as prescribed Maintain adequate hydration Follow heart healthy diet Continue to wean oxygen supplementation as tolerated to room air Repeat basic metabolic panel in 5 days to follow-up lites renal function Complete oral antibiotics as instructed make sure to take around food to minimize GI upset symptoms. Physical rehabilitation and conditioning as per skilled nursing facility protocol     Allergies as of 06/22/2020      Reactions   Pravastatin Sodium    REACTION: Leg weakness   Rosuvastatin    REACTION: Body aches   Bee Venom Swelling, Rash  Medication List    TAKE these medications   amLODipine 10 MG tablet Commonly known as: NORVASC Take 10 mg by mouth daily.   amoxicillin-clavulanate 875-125 MG tablet Commonly known as: AUGMENTIN Take 1 tablet by mouth every 12 (twelve) hours for 5 days.    aspirin 81 MG tablet Take 81 mg by mouth daily.   dextromethorphan-guaiFENesin 30-600 MG 12hr tablet Commonly known as: MUCINEX DM Take 1 tablet by mouth 2 (two) times daily for 10 days.   hydrochlorothiazide 12.5 MG tablet Commonly known as: HYDRODIURIL Take 12.5 mg by mouth every morning.   losartan 100 MG tablet Commonly known as: COZAAR Take 100 mg by mouth every morning.   metFORMIN 500 MG 24 hr tablet Commonly known as: GLUCOPHAGE-XR Take 1,000 mg by mouth 2 (two) times daily.   potassium chloride 20 MEQ packet Commonly known as: KLOR-CON Take 40 mEq by mouth daily.      Allergies  Allergen Reactions  . Pravastatin Sodium     REACTION: Leg weakness  . Rosuvastatin     REACTION: Body aches  . Bee Venom Swelling and Rash    Contact information for follow-up providers    Celene Squibb, MD. Schedule an appointment as soon as possible for a visit in 10 day(s).   Specialty: Internal Medicine Contact information: [place] [address] [phone]            Contact information for after-discharge care    [facility] .   Service: Skilled Nursing Contact information: [address] [place] [phone]                  The results of significant diagnostics from this hospitalization (including imaging, microbiology, ancillary and laboratory) are listed below for reference.    Significant Diagnostic Studies: DG Chest 1 View  Result Date: 06/18/2020 CLINICAL DATA:  Status post thoracentesis. EXAM: CHEST  1 VIEW COMPARISON:  Single-view of the chest and CT chest 06/17/2020. FINDINGS: Left pleural effusion is decreased after thoracentesis. No pneumothorax. Focal opacity in the left upper lung zone is presumably due to residual atelectasis. Right lung is clear. Cardiomegaly noted. IMPRESSION: Decreased left pleural effusion after thoracentesis. No pneumothorax.  Electronically Signed   By: Inge Rise M.D.   On: 06/18/2020 13:12   CT Head Wo Contrast  Result Date: 06/17/2020 CLINICAL DATA:  Pain following fall.  Nausea. EXAM: CT HEAD WITHOUT CONTRAST TECHNIQUE: Contiguous axial images were obtained from the base of the skull through the vertex without intravenous contrast. COMPARISON:  July 28, 2010 FINDINGS: Brain: There is age related volume loss. There is no intracranial mass, hemorrhage, extra-axial fluid collection, or midline shift. There is small vessel disease in the centra semiovale bilaterally. Prior tiny lacunar infarct in the anterior limb of the left internal capsule noted. Age uncertain small lacunar infarct in the left thalamus noted. Elsewhere, brain parenchyma unremarkable. Vascular: No hyperdense vessel. There is calcification in each carotid siphon region. Skull: Bony calvarium appears intact. Sinuses/Orbits: Mucosal thickening noted in several ethmoid air cells. Orbits appear symmetric bilaterally. Other: Mastoid air cells are clear. IMPRESSION: Age related volume loss with patchy periventricular small vessel disease. Prior tiny lacunar infarct in the anterior limb of the left internal capsule. Age uncertain small infarct in the left thalamus. Elsewhere brain parenchyma appears unremarkable. Foci of arterial vascular calcification noted. Mucosal thickening noted in several ethmoid air cells. Electronically Signed  By: Lowella Grip III M.D.   On: 06/17/2020 20:21   CT Chest W Contrast  Result Date: 06/17/2020 CLINICAL DATA:  Chest pain shortness of breath, effusion EXAM: CT CHEST WITH CONTRAST TECHNIQUE: Multidetector CT imaging of the chest was performed during intravenous contrast administration. CONTRAST:  47mL OMNIPAQUE IOHEXOL 300 MG/ML  SOLN COMPARISON:  Radiograph 06/17/2020 FINDINGS: Cardiovascular: Cardiac size is top normal. Trace pericardial effusion with fluid in the pericardial recesses. Three-vessel coronary artery  atherosclerosis. The aortic root is suboptimally assessed given cardiac pulsation artifact. Atherosclerotic plaque within the normal caliber aorta. No acute luminal abnormality of the imaged aorta. No periaortic stranding or hemorrhage. Normal 3 vessel branching of the aortic arch. Proximal great vessels are heavily calcified but otherwise unremarkable. Suboptimal opacification of the normal caliber central pulmonary arteries on this non tailored exam. No large central filling defect. No major venous abnormality Mediastinum/Nodes: Fluid in the pericardial recesses, as above scattered prominent though nonenlarged mediastinal and hilar nodes for instance an 8 mm AP window lymph node (2/55), and a 9 mm subcarinal node 2/64. no acute abnormality of the trachea small sliding-type hernia. Heterogeneous multinodular thyroid gland with multiple hypoattenuating and partially calcified nodules, largest measuring up to 1.8 cm in the left lobe thyroid. Lungs/Pleura: Complex loculated left pleural effusion extending within the fissures and over the left lung apex. Adjacent areas of passive atelectasis are present in the left lung base with fairly uniform enhancement of the underlying, atelectatic lung parenchyma. There is a trace right pleural effusion with minimal adjacent atelectasis in the lung right lung. 4 mm subpleural nodule seen in the medial right lung apex (4/30). Small 4 mm ground-glass nodule present in the posterior segment right upper lobe as well (4/55). No other discrete pulmonary nodules or masses. No pneumothorax. Features of mild interstitial edema including some vascular redistribution interlobular septal thickening. Upper Abdomen: No acute abnormalities present in the visualized portions of the upper abdomen. Bilobed, fluid attenuation cystic lesion in the right lobe liver (2/120) measuring up to a 1.7 cm in size. No other focal liver lesions. Musculoskeletal: Diffuse mild body wall edema, left greater than  right. Some nonspecific focal skin thickening seen along the lower inner quadrant of the right breast, could reflect some dermal irritation, 2/87, correlate with visual inspection. No other focal or worrisome chest wall lesions. The osseous structures appear diffusely demineralized which may limit detection of small or nondisplaced fractures. Multilevel degenerative changes are present in the imaged portions of the spine. Additional degenerative changes in the shoulders. No acute or worrisome osseous lesions. IMPRESSION: 1. Complex loculated left pleural effusion extending within the fissures and over the left lung apex. Adjacent areas of passive atelectasis in the left lung base. Fairly uniform enhancement of the underlying, atelectatic lung parenchyma though underlying consolidation or lesion is not fully excluded. 2. Trace right pleural effusion with minimal adjacent atelectasis in the lung right lung. 3. Features of mild interstitial edema including some vascular redistribution interlobular septal thickening. 4. Three-vessel coronary artery atherosclerosis. 5. Heterogeneous multinodular thyroid gland. In the setting of significant comorbidities or limited life expectancy, no follow-up recommended (ref: J Am Coll Radiol. 2015 Feb;12(2): 143-50). 6. At least 2 small nodules 1 appearing solid in the other sub solid in nature within the right upper lobe. Non-contrast chest CT at 3-6 months is recommended. If nodules persist and are stable at that time, consider additional non-contrast chest CT examinations at 2 and 4 years. This recommendation follows the consensus statement: Guidelines for  Management of Incidental Pulmonary Nodules Detected on CT Images: From the Fleischner Society 2017; Radiology 2017; 284:228-243. 7. Some nonspecific focal skin thickening along the lower inner quadrant of the right breast, correlate with visual inspection. 8. Small sliding-type hernia. 9. Aortic Atherosclerosis (ICD10-I70.0).  Electronically Signed   By: Lovena Le M.D.   On: 06/17/2020 22:26   DG Chest Portable 1 View  Result Date: 06/17/2020 CLINICAL DATA:  86 year old female with cough, weakness. Fall today. EXAM: PORTABLE CHEST 1 VIEW COMPARISON:  Portable chest 07/28/2010. FINDINGS: Portable AP upright view at 2043 hours. Moderate opacification of the left hemithorax, most resembles a pleural effusion with dense opacification of the lung base. Some of the mediastinal contours are also obscured, and there is increased density at the left hilum. The right lung appears clear allowing for portable technique. No pneumothorax. Right mediastinal contours appear normal. Calcified aortic atherosclerosis. Chronic deviation of the trachea to the right is stable. No acute rib fracture identified. No acute osseous abnormality identified. Paucity of bowel gas in the upper abdomen. IMPRESSION: 1. Opacification of the left lower lung. At least part of this is felt to reflect pleural effusion but underlying consolidation or mass cannot be excluded, and there is suspicious increased density at the left hilum. Chest CT (IV contrast preferred) would best evaluate further. 2. Right lung is clear.  No acute traumatic injury is identified. Electronically Signed   By: Genevie Ann M.D.   On: 06/17/2020 21:24   DG Knee Complete 4 Views Right  Result Date: 06/20/2020 CLINICAL DATA:  86 year old female with history of fall. EXAM: RIGHT KNEE - COMPLETE 4+ VIEW COMPARISON:  04/17/2013 FINDINGS: No acute fracture or malalignment. Severe lateral compartment and moderate medial compartment joint space narrowing. There is associated subchondral sclerosis. There is periarticular osteophyte formation, most prominent about the lateral proximal tibia. Osteopenia. Large joint effusion. Scattered atherosclerotic calcifications. IMPRESSION: 1. Large knee joint effusion.  No acute fracture or malalignment. 2. Severe tricompartmental degenerative changes. 3.  Atherosclerotic calcifications. Electronically Signed   By: Ruthann Cancer MD   On: 06/20/2020 13:11   DG HIP UNILAT WITH PELVIS 2-3 VIEWS RIGHT  Result Date: 06/20/2020 CLINICAL DATA:  86 year old female with history of fall. EXAM: DG HIP (WITH OR WITHOUT PELVIS) 2-3V RIGHT COMPARISON:  None. FINDINGS: There is no evidence of hip fracture or dislocation. Punctate loose body in the lateral supra trochanteric joint space. There is no evidence of significant arthropathy or other focal bone abnormality. Diffuse osteopenia. Limited evaluation of the superior pelvis, sacrum, lower lumbar spine due to overlying bowel gas and stool. Severe appearing discogenic degenerative changes of the lower lumbar spine. IMPRESSION: No radiographic evidence of acute fracture. If right hip pain persistent clinically indicated, MRI hip is recommended for evaluation of possible radiographically occult fracture in this osteopenic patient. Electronically Signed   By: Ruthann Cancer MD   On: 06/20/2020 13:09   US THORACENTESIS ASP PLEURAL SPACE W/IMG GUIDE  Result Date: 06/18/2020 INDICATION: Patient with history of dyspnea, cough, loculated left pleural effusion; request received for diagnostic and therapeutic left thoracentesis. EXAM: ULTRASOUND GUIDED DIAGNOSTIC AND THERAPEUTIC LEFT THORACENTESIS MEDICATIONS: 1% lidocaine to skin/SQ tissue COMPLICATIONS: None immediate. PROCEDURE: An ultrasound guided thoracentesis was thoroughly discussed with the patient and questions answered. The benefits, risks, alternatives and complications were also discussed. The patient understands and wishes to proceed with the procedure. Written consent was obtained. Ultrasound was performed to localize and mark an adequate pocket of fluid in the left chest. The  area was then prepped and draped in the normal sterile fashion. 1% Lidocaine was used for local anesthesia. Under ultrasound guidance a 19 gauge, 7-cm, Yueh catheter was introduced.  Thoracentesis was performed. The catheter was removed and a dressing applied. FINDINGS: A total of approximately 570 cc of yellow fluid was removed. Samples were sent to the laboratory as requested by the clinical team. IMPRESSION: Successful ultrasound guided diagnostic and therapeutic left thoracentesis yielding 570 cc of pleural fluid. Read by: Rowe Robert, PA-C Electronically Signed   By: Markus Daft M.D.   On: 06/18/2020 12:57    Microbiology: Recent Results (from the past 240 hour(s))  Respiratory Panel by RT PCR (Flu A&B, Covid) - Nasopharyngeal Swab     Status: None   Collection Time: 06/17/20  7:58 PM   Specimen: Nasopharyngeal Swab  Result Value Ref Range Status   SARS Coronavirus 2 by RT PCR NEGATIVE NEGATIVE Final    Comment: (NOTE) SARS-CoV-2 target nucleic acids are NOT DETECTED.  The SARS-CoV-2 RNA is generally detectable in upper respiratoy specimens during the acute phase of infection. The lowest concentration of SARS-CoV-2 viral copies this assay can detect is 131 copies/mL. A negative result does not preclude SARS-Cov-2 infection and should not be used as the sole basis for treatment or other patient management decisions. A negative result may occur with  improper specimen collection/handling, submission of specimen other than nasopharyngeal swab, presence of viral mutation(s) within the areas targeted by this assay, and inadequate number of viral copies (<131 copies/mL). A negative result must be combined with clinical observations, patient history, and epidemiological information. The expected result is Negative.  Fact Sheet for Patients:  PinkCheek.be  Fact Sheet for Healthcare Providers:  GravelBags.it  This test is no t yet approved or cleared by the [place] FDA and  has been authorized for detection and/or diagnosis of SARS-CoV-2 by FDA under an Emergency Use Authorization (EUA). This EUA will  remain  in effect (meaning this test can be used) for the duration of the COVID-19 declaration under Section 564(b)(1) of the Act, 21 U.S.C. section 360bbb-3(b)(1), unless the authorization is terminated or revoked sooner.     Influenza A by PCR NEGATIVE NEGATIVE Final   Influenza B by PCR NEGATIVE NEGATIVE Final    Comment: (NOTE) The Xpert Xpress SARS-CoV-2/FLU/RSV assay is intended as an aid in  the diagnosis of influenza from Nasopharyngeal swab specimens and  should not be used as a sole basis for treatment. Nasal washings and  aspirates are unacceptable for Xpert Xpress SARS-CoV-2/FLU/RSV  testing.  Fact Sheet for Patients: PinkCheek.be  Fact Sheet for Healthcare Providers: GravelBags.it  This test is not yet approved or cleared by the [place] FDA and  has been authorized for detection and/or diagnosis of SARS-CoV-2 by  FDA under an Emergency Use Authorization (EUA). This EUA will remain  in effect (meaning this test can be used) for the duration of the  Covid-19 declaration under Section 564(b)(1) of the Act, 21  U.S.C. section 360bbb-3(b)(1), unless the authorization is  terminated or revoked. Performed at [facility], [address] [place], [place] [address]   Gram stain     Status: None   Collection Time: 06/18/20 12:34 PM   Specimen: Pleura; Body Fluid  Result Value Ref Range Status   Specimen Description PLEURAL  Final   Special Requests BOTTLES DRAWN AEROBIC AND ANAEROBIC 10CC  Final   Gram Stain   Final    NO ORGANISMS SEEN WBC PRESENT,BOTH PMN  AND MONONUCLEAR Performed at [facility], [address], [place], [place] [address]    Report Status 06/18/2020 FINAL  Final  Culture, body fluid-bottle     Status: None (Preliminary result)   Collection Time: 06/18/20 12:34 PM   Specimen: Pleura  Result Value Ref Range Status   Specimen Description PLEURAL  Final   Special Requests 10CC  Final    Culture   Final    NO GROWTH 4 DAYS Performed at [facility], [address] [place],  [address]    Report Status PENDING  Incomplete     Labs: Basic Metabolic Panel: Recent Labs  Lab 06/17/20 2038 06/18/20 0622 06/21/20 0615  NA 132* 136 130*  K 3.3* 3.4* 4.0  CL 98 100 98  CO2 22 24 26   GLUCOSE 182* 138* 160*  BUN 13 11 9   CREATININE 0.47 0.41* 0.41*  CALCIUM 8.7* 8.8* 8.3*  MG  --  1.8  --    Liver Function Tests: Recent Labs  Lab 06/17/20 2038 06/18/20 0622  AST 13* 11*  ALT 12 12  ALKPHOS 121 113  BILITOT 0.8 0.9  PROT 6.7 6.3*  ALBUMIN 2.7* 2.5*   CBC: Recent Labs  Lab 06/17/20 2038 06/18/20 0622 06/21/20 0615  WBC 16.6* 14.3* 14.3*  NEUTROABS 13.8* 12.2*  --   HGB 11.7* 11.7* 9.9*  HCT 34.4* 34.6* 30.1*  MCV 88.2 87.8 89.6  PLT 448* 462* 432*    Signed:  Barton Dubois MD.  [facility] 06/22/2020, 1:51 PM

## 2020-06-23 LAB — CULTURE, BODY FLUID W GRAM STAIN -BOTTLE: Culture: NO GROWTH

## 2020-06-25 DIAGNOSIS — N1832 Chronic kidney disease, stage 3b: Secondary | ICD-10-CM | POA: Diagnosis not present

## 2020-06-25 DIAGNOSIS — E119 Type 2 diabetes mellitus without complications: Secondary | ICD-10-CM | POA: Diagnosis not present

## 2020-06-25 DIAGNOSIS — I10 Essential (primary) hypertension: Secondary | ICD-10-CM | POA: Diagnosis not present

## 2020-06-25 DIAGNOSIS — M6281 Muscle weakness (generalized): Secondary | ICD-10-CM | POA: Diagnosis not present

## 2020-06-25 DIAGNOSIS — J189 Pneumonia, unspecified organism: Secondary | ICD-10-CM | POA: Diagnosis not present

## 2020-06-30 DIAGNOSIS — J189 Pneumonia, unspecified organism: Secondary | ICD-10-CM | POA: Diagnosis not present

## 2020-07-23 DIAGNOSIS — E119 Type 2 diabetes mellitus without complications: Secondary | ICD-10-CM | POA: Diagnosis not present

## 2020-07-23 DIAGNOSIS — M6281 Muscle weakness (generalized): Secondary | ICD-10-CM | POA: Diagnosis not present

## 2020-07-23 DIAGNOSIS — I10 Essential (primary) hypertension: Secondary | ICD-10-CM | POA: Diagnosis not present

## 2020-07-23 DIAGNOSIS — J189 Pneumonia, unspecified organism: Secondary | ICD-10-CM | POA: Diagnosis not present

## 2020-07-23 DIAGNOSIS — N1832 Chronic kidney disease, stage 3b: Secondary | ICD-10-CM | POA: Diagnosis not present

## 2020-07-27 DIAGNOSIS — E1122 Type 2 diabetes mellitus with diabetic chronic kidney disease: Secondary | ICD-10-CM | POA: Diagnosis not present

## 2020-07-27 DIAGNOSIS — N1832 Chronic kidney disease, stage 3b: Secondary | ICD-10-CM | POA: Diagnosis not present

## 2020-07-27 DIAGNOSIS — Z7984 Long term (current) use of oral hypoglycemic drugs: Secondary | ICD-10-CM | POA: Diagnosis not present

## 2020-07-27 DIAGNOSIS — J181 Lobar pneumonia, unspecified organism: Secondary | ICD-10-CM | POA: Diagnosis not present

## 2020-07-27 DIAGNOSIS — E441 Mild protein-calorie malnutrition: Secondary | ICD-10-CM | POA: Diagnosis not present

## 2020-07-27 DIAGNOSIS — I129 Hypertensive chronic kidney disease with stage 1 through stage 4 chronic kidney disease, or unspecified chronic kidney disease: Secondary | ICD-10-CM | POA: Diagnosis not present

## 2020-07-29 DIAGNOSIS — I10 Essential (primary) hypertension: Secondary | ICD-10-CM | POA: Diagnosis not present

## 2020-07-29 DIAGNOSIS — E782 Mixed hyperlipidemia: Secondary | ICD-10-CM | POA: Diagnosis not present

## 2020-07-29 DIAGNOSIS — R002 Palpitations: Secondary | ICD-10-CM | POA: Diagnosis not present

## 2020-07-29 DIAGNOSIS — E1165 Type 2 diabetes mellitus with hyperglycemia: Secondary | ICD-10-CM | POA: Diagnosis not present

## 2020-07-29 DIAGNOSIS — G720 Drug-induced myopathy: Secondary | ICD-10-CM | POA: Diagnosis not present

## 2020-07-29 DIAGNOSIS — E7849 Other hyperlipidemia: Secondary | ICD-10-CM | POA: Diagnosis not present

## 2020-08-04 DIAGNOSIS — E119 Type 2 diabetes mellitus without complications: Secondary | ICD-10-CM | POA: Diagnosis not present

## 2020-08-04 DIAGNOSIS — E785 Hyperlipidemia, unspecified: Secondary | ICD-10-CM | POA: Diagnosis not present

## 2020-08-04 DIAGNOSIS — J189 Pneumonia, unspecified organism: Secondary | ICD-10-CM | POA: Diagnosis not present

## 2020-08-05 DIAGNOSIS — E1122 Type 2 diabetes mellitus with diabetic chronic kidney disease: Secondary | ICD-10-CM | POA: Diagnosis not present

## 2020-08-05 DIAGNOSIS — E441 Mild protein-calorie malnutrition: Secondary | ICD-10-CM | POA: Diagnosis not present

## 2020-08-05 DIAGNOSIS — N1832 Chronic kidney disease, stage 3b: Secondary | ICD-10-CM | POA: Diagnosis not present

## 2020-08-05 DIAGNOSIS — Z7984 Long term (current) use of oral hypoglycemic drugs: Secondary | ICD-10-CM | POA: Diagnosis not present

## 2020-08-05 DIAGNOSIS — I129 Hypertensive chronic kidney disease with stage 1 through stage 4 chronic kidney disease, or unspecified chronic kidney disease: Secondary | ICD-10-CM | POA: Diagnosis not present

## 2020-08-05 DIAGNOSIS — J181 Lobar pneumonia, unspecified organism: Secondary | ICD-10-CM | POA: Diagnosis not present

## 2020-08-17 DIAGNOSIS — Z0001 Encounter for general adult medical examination with abnormal findings: Secondary | ICD-10-CM | POA: Diagnosis not present

## 2020-08-17 DIAGNOSIS — M1711 Unilateral primary osteoarthritis, right knee: Secondary | ICD-10-CM | POA: Diagnosis not present

## 2020-08-17 DIAGNOSIS — M25561 Pain in right knee: Secondary | ICD-10-CM | POA: Diagnosis not present

## 2020-08-17 DIAGNOSIS — R911 Solitary pulmonary nodule: Secondary | ICD-10-CM | POA: Diagnosis not present

## 2020-08-17 DIAGNOSIS — E782 Mixed hyperlipidemia: Secondary | ICD-10-CM | POA: Diagnosis not present

## 2020-08-17 DIAGNOSIS — M479 Spondylosis, unspecified: Secondary | ICD-10-CM | POA: Diagnosis not present

## 2020-08-17 DIAGNOSIS — E1165 Type 2 diabetes mellitus with hyperglycemia: Secondary | ICD-10-CM | POA: Diagnosis not present

## 2020-08-17 DIAGNOSIS — I10 Essential (primary) hypertension: Secondary | ICD-10-CM | POA: Diagnosis not present

## 2020-09-04 DIAGNOSIS — E1165 Type 2 diabetes mellitus with hyperglycemia: Secondary | ICD-10-CM | POA: Diagnosis not present

## 2020-09-04 DIAGNOSIS — R002 Palpitations: Secondary | ICD-10-CM | POA: Diagnosis not present

## 2020-09-04 DIAGNOSIS — E782 Mixed hyperlipidemia: Secondary | ICD-10-CM | POA: Diagnosis not present

## 2020-09-04 DIAGNOSIS — G720 Drug-induced myopathy: Secondary | ICD-10-CM | POA: Diagnosis not present

## 2020-09-04 DIAGNOSIS — I10 Essential (primary) hypertension: Secondary | ICD-10-CM | POA: Diagnosis not present

## 2020-09-04 DIAGNOSIS — E7849 Other hyperlipidemia: Secondary | ICD-10-CM | POA: Diagnosis not present

## 2020-09-05 DIAGNOSIS — E441 Mild protein-calorie malnutrition: Secondary | ICD-10-CM | POA: Diagnosis not present

## 2020-09-05 DIAGNOSIS — I129 Hypertensive chronic kidney disease with stage 1 through stage 4 chronic kidney disease, or unspecified chronic kidney disease: Secondary | ICD-10-CM | POA: Diagnosis not present

## 2020-09-05 DIAGNOSIS — E1122 Type 2 diabetes mellitus with diabetic chronic kidney disease: Secondary | ICD-10-CM | POA: Diagnosis not present

## 2020-09-05 DIAGNOSIS — Z7984 Long term (current) use of oral hypoglycemic drugs: Secondary | ICD-10-CM | POA: Diagnosis not present

## 2020-09-05 DIAGNOSIS — J181 Lobar pneumonia, unspecified organism: Secondary | ICD-10-CM | POA: Diagnosis not present

## 2020-09-05 DIAGNOSIS — N1832 Chronic kidney disease, stage 3b: Secondary | ICD-10-CM | POA: Diagnosis not present

## 2020-10-03 DIAGNOSIS — E1165 Type 2 diabetes mellitus with hyperglycemia: Secondary | ICD-10-CM | POA: Diagnosis not present

## 2020-10-03 DIAGNOSIS — R002 Palpitations: Secondary | ICD-10-CM | POA: Diagnosis not present

## 2020-10-03 DIAGNOSIS — I10 Essential (primary) hypertension: Secondary | ICD-10-CM | POA: Diagnosis not present

## 2020-10-03 DIAGNOSIS — E782 Mixed hyperlipidemia: Secondary | ICD-10-CM | POA: Diagnosis not present

## 2020-10-03 DIAGNOSIS — E7849 Other hyperlipidemia: Secondary | ICD-10-CM | POA: Diagnosis not present

## 2020-10-03 DIAGNOSIS — G720 Drug-induced myopathy: Secondary | ICD-10-CM | POA: Diagnosis not present

## 2020-10-30 ENCOUNTER — Other Ambulatory Visit: Payer: Self-pay

## 2020-10-30 ENCOUNTER — Ambulatory Visit: Payer: PRIVATE HEALTH INSURANCE

## 2020-10-30 ENCOUNTER — Encounter: Payer: Self-pay | Admitting: Orthopedic Surgery

## 2020-10-30 ENCOUNTER — Ambulatory Visit (INDEPENDENT_AMBULATORY_CARE_PROVIDER_SITE_OTHER): Payer: PPO | Admitting: Orthopedic Surgery

## 2020-10-30 VITALS — BP 143/82 | HR 86 | Ht 64.0 in | Wt 136.0 lb

## 2020-10-30 DIAGNOSIS — M25561 Pain in right knee: Secondary | ICD-10-CM

## 2020-10-30 DIAGNOSIS — M19011 Primary osteoarthritis, right shoulder: Secondary | ICD-10-CM

## 2020-10-30 DIAGNOSIS — M1711 Unilateral primary osteoarthritis, right knee: Secondary | ICD-10-CM | POA: Diagnosis not present

## 2020-10-30 DIAGNOSIS — G8929 Other chronic pain: Secondary | ICD-10-CM

## 2020-10-30 MED ORDER — TRAMADOL HCL 50 MG PO TABS: 50.0000 mg | ORAL_TABLET | Freq: Four times a day (QID) | ORAL | 0 refills | Status: DC | PRN

## 2020-10-30 NOTE — Patient Instructions (Signed)

## 2020-10-30 NOTE — Progress Notes (Signed)
New Patient Visit  Assessment: Whitney Watts is a 87 y.o. female with the following: Right knee arthritis Right glenohumeral arthritis  Plan: Whitney Watts continues to remain active, and remains independent, living at home by herself.  We reviewed radiographs in clinic today, which demonstrates advanced arthritis in both her right knee as well as her right shoulder.  The description of her pain is consistent with arthritis.  She can continue taking Tylenol as needed.  She does have kidney disease, and is unable to take NSAIDs.  I have provided a prescription for tramadol for her to take on an as-needed basis.  She is aware of the potential for drowsiness while taking this medication.  She has previously had a steroid injection in her right knee, with excellent relief of her symptoms.  I have offered this to her, and she has elected to proceed.  In regards to her right shoulder, she is also interested in a steroid injection.  She tolerated both of these procedures in the clinic very well.  She is to remain careful, and contact the clinic if she has any issues.  In regards to brace for her right knee, any brace which provides stability and improves her symptoms as appropriate.  No specific recommendations were made in clinic today.  Procedure note injection - Right Knee joint   Verbal consent was obtained to inject the Right Knee joint  Timeout was completed to confirm the site of injection.  The skin was prepped with alcohol and ethyl chloride was sprayed at the injection site.  A 21-gauge needle was used to inject 6 mg of Betamethasone and 1% lidocaine (3 cc) into the Right Knee using an Anterolateral approach.  There were no complications. A sterile bandage was applied.  Procedure note injection - Right Shoulder joint (subacromial space)    Verbal consent was obtained to inject the Right Shoulder joint  Timeout was completed to confirm the site of injection.  The skin was prepped with alcohol and  ethyl chloride was sprayed at the injection site.  A 21-gauge needle was used to inject 6 mg of Betamethasone and 1% lidocaine (3 cc) into the Right Shoulder using an Posterolateral approach.  There were no complications. A sterile bandage was applied.    Follow-up: Return if symptoms worsen or fail to improve.  Subjective:  Chief Complaint  Patient presents with  . Shoulder Pain    Right shoulder pain, Patient reports going on for 8 years,   . Knee Pain    Right, Patient reports she has been hurting about 8 years,     History of Present Illness: Whitney Watts is a 87 y.o. RHD female who presents for evaluation of right knee pain, as well as right shoulder pain.  No specific injury.  She has had ongoing pain in her knee and shoulder for several years.  She previously saw Dr. Aline Brochure, and had a steroid injection in the right knee which provided excellent relief.  She estimates that this was approximately 8 years ago.  She wears a brace on her right knee, and the compression does provide stability and improves her symptoms.  She is a walker to assist with ambulation.  She takes Tylenol, but this is insufficient in controlling her pain.  She has advanced chronic kidney disease, and cannot tolerate NSAIDs as a result.  She lives at home, by herself.  She is able to complete most of her activities of daily living.  She does require some  assistance with chores and errands.  She is able to drive some.  She was recently hospitalized for pneumonia, and did get some physical therapy while in the hospital.   Review of Systems: No fevers or chills No numbness or tingling No chest pain No shortness of breath No bowel or bladder dysfunction No GI distress No headaches   Medical History:  Past Medical History:  Diagnosis Date  . DM (diabetes mellitus) ([place])   . HTN (hypertension)     Past Surgical History:  Procedure Laterality Date  . ABDOMINAL HYSTERECTOMY    . APPENDECTOMY    .  LUMBAR DISC SURGERY      Family History  Problem Relation Age of Onset  . Diabetes Other   . Cancer Other   . Arthritis Other    Social History   Tobacco Use  . Smoking status: Never Smoker  . Smokeless tobacco: Never Used  Vaping Use  . Vaping Use: Never used  Substance Use Topics  . Alcohol use: No  . Drug use: No    Allergies  Allergen Reactions  . Bee Venom Swelling and Rash  . Pravastatin Sodium Other (See Comments)    REACTION: Leg weakness  . Rosuvastatin Other (See Comments)    REACTION: Body aches    Current Meds  Medication Sig  . amLODipine (NORVASC) 10 MG tablet Take 10 mg by mouth daily.  Marland Kitchen aspirin 81 MG tablet Take 81 mg by mouth daily.  . hydrochlorothiazide (HYDRODIURIL) 12.5 MG tablet Take 12.5 mg by mouth every morning.  Marland Kitchen losartan (COZAAR) 100 MG tablet Take 100 mg by mouth every morning.  . metFORMIN (GLUCOPHAGE-XR) 500 MG 24 hr tablet Take 1,000 mg by mouth 2 (two) times daily.  . potassium chloride (KLOR-CON) 20 MEQ packet Take 40 mEq by mouth daily.  . traMADol (ULTRAM) 50 MG tablet Take 1 tablet (50 mg total) by mouth every 6 (six) hours as needed.    Objective: BP (!) 143/82   Pulse 86   Ht '5\' 4"'$  (1.626 m)   Wt 136 lb (61.7 kg) Comment: Per patient.  BMI 23.34 kg/m   Physical Exam:  General: Alert and oriented.  No acute distress.  Elderly female, sitting comfortably. Gait: Ambulates with the assistance of a walker. stooped posture.  Evaluation of the right knee demonstrates a valgus overall alignment.  She has full range of motion from just short of full extension to 120 degrees without discomfort.  She does have tenderness to palpation along the medial lateral joint line.  No increased instability to varus valgus stress.  Negative Lachman.  Mild effusion of the right knee.  Evaluation of the right shoulder demonstrates general atrophy around the shoulder.  Limited forward flexion to 100 degrees.  Abduction in her side to 75 degrees.   Negative belly press.  Fingers are warm and well-perfused.  Sensation is intact distally.    IMAGING: I personally ordered and reviewed the following images  X-rays of the right knee were obtained in clinic today and demonstrates a valgus overall alignment.  Complete loss of joint space within the lateral compartment.  Osteophytes are present within all 3 compartments.  Impression: Severe right knee arthritis  X-rays of the right shoulder were obtained in clinic today and demonstrates loss of the glenohumeral joint space.  There is no proximal humeral migration.  No acute injuries are noted.  Impression: Right glenohumeral arthritis  New Medications:  Meds ordered this encounter  Medications  . traMADol Veatrice Bourbon)  50 MG tablet    Sig: Take 1 tablet (50 mg total) by mouth every 6 (six) hours as needed.    Dispense:  30 tablet    Refill:  0      Mordecai Rasmussen, MD  10/30/2020 12:10 PM

## 2020-11-02 DIAGNOSIS — E1165 Type 2 diabetes mellitus with hyperglycemia: Secondary | ICD-10-CM | POA: Diagnosis not present

## 2020-11-02 DIAGNOSIS — R002 Palpitations: Secondary | ICD-10-CM | POA: Diagnosis not present

## 2020-11-02 DIAGNOSIS — I10 Essential (primary) hypertension: Secondary | ICD-10-CM | POA: Diagnosis not present

## 2020-11-02 DIAGNOSIS — E782 Mixed hyperlipidemia: Secondary | ICD-10-CM | POA: Diagnosis not present

## 2020-11-02 DIAGNOSIS — G720 Drug-induced myopathy: Secondary | ICD-10-CM | POA: Diagnosis not present

## 2020-11-02 DIAGNOSIS — E7849 Other hyperlipidemia: Secondary | ICD-10-CM | POA: Diagnosis not present

## 2020-12-02 DIAGNOSIS — E1165 Type 2 diabetes mellitus with hyperglycemia: Secondary | ICD-10-CM | POA: Diagnosis not present

## 2020-12-02 DIAGNOSIS — I10 Essential (primary) hypertension: Secondary | ICD-10-CM | POA: Diagnosis not present

## 2020-12-02 DIAGNOSIS — G720 Drug-induced myopathy: Secondary | ICD-10-CM | POA: Diagnosis not present

## 2020-12-02 DIAGNOSIS — E782 Mixed hyperlipidemia: Secondary | ICD-10-CM | POA: Diagnosis not present

## 2020-12-02 DIAGNOSIS — R002 Palpitations: Secondary | ICD-10-CM | POA: Diagnosis not present

## 2020-12-07 DIAGNOSIS — M25561 Pain in right knee: Secondary | ICD-10-CM | POA: Diagnosis not present

## 2020-12-07 DIAGNOSIS — M1711 Unilateral primary osteoarthritis, right knee: Secondary | ICD-10-CM | POA: Diagnosis not present

## 2020-12-07 DIAGNOSIS — Z0000 Encounter for general adult medical examination without abnormal findings: Secondary | ICD-10-CM | POA: Diagnosis not present

## 2020-12-07 DIAGNOSIS — Z9181 History of falling: Secondary | ICD-10-CM | POA: Diagnosis not present

## 2020-12-07 DIAGNOSIS — E1165 Type 2 diabetes mellitus with hyperglycemia: Secondary | ICD-10-CM | POA: Diagnosis not present

## 2020-12-07 DIAGNOSIS — M1990 Unspecified osteoarthritis, unspecified site: Secondary | ICD-10-CM | POA: Diagnosis not present

## 2020-12-07 DIAGNOSIS — Z7984 Long term (current) use of oral hypoglycemic drugs: Secondary | ICD-10-CM | POA: Diagnosis not present

## 2020-12-07 DIAGNOSIS — M25562 Pain in left knee: Secondary | ICD-10-CM | POA: Diagnosis not present

## 2020-12-07 DIAGNOSIS — R911 Solitary pulmonary nodule: Secondary | ICD-10-CM | POA: Diagnosis not present

## 2020-12-07 DIAGNOSIS — I10 Essential (primary) hypertension: Secondary | ICD-10-CM | POA: Diagnosis not present

## 2020-12-07 DIAGNOSIS — G720 Drug-induced myopathy: Secondary | ICD-10-CM | POA: Diagnosis not present

## 2020-12-07 DIAGNOSIS — E782 Mixed hyperlipidemia: Secondary | ICD-10-CM | POA: Diagnosis not present

## 2020-12-07 DIAGNOSIS — Z974 Presence of external hearing-aid: Secondary | ICD-10-CM | POA: Diagnosis not present

## 2020-12-07 DIAGNOSIS — M479 Spondylosis, unspecified: Secondary | ICD-10-CM | POA: Diagnosis not present

## 2020-12-07 DIAGNOSIS — Z6828 Body mass index (BMI) 28.0-28.9, adult: Secondary | ICD-10-CM | POA: Diagnosis not present

## 2021-01-03 DIAGNOSIS — E1165 Type 2 diabetes mellitus with hyperglycemia: Secondary | ICD-10-CM | POA: Diagnosis not present

## 2021-01-03 DIAGNOSIS — M1990 Unspecified osteoarthritis, unspecified site: Secondary | ICD-10-CM | POA: Diagnosis not present

## 2021-02-12 ENCOUNTER — Other Ambulatory Visit: Payer: Self-pay | Admitting: Orthopedic Surgery

## 2021-02-17 DIAGNOSIS — M1990 Unspecified osteoarthritis, unspecified site: Secondary | ICD-10-CM | POA: Diagnosis not present

## 2021-02-17 DIAGNOSIS — E1165 Type 2 diabetes mellitus with hyperglycemia: Secondary | ICD-10-CM | POA: Diagnosis not present

## 2021-03-19 ENCOUNTER — Ambulatory Visit (INDEPENDENT_AMBULATORY_CARE_PROVIDER_SITE_OTHER): Payer: PPO | Admitting: Orthopedic Surgery

## 2021-03-19 ENCOUNTER — Encounter: Payer: Self-pay | Admitting: Orthopedic Surgery

## 2021-03-19 ENCOUNTER — Other Ambulatory Visit: Payer: Self-pay

## 2021-03-19 VITALS — BP 140/74 | HR 85 | Ht 64.0 in | Wt 135.0 lb

## 2021-03-19 DIAGNOSIS — M1711 Unilateral primary osteoarthritis, right knee: Secondary | ICD-10-CM

## 2021-03-19 DIAGNOSIS — M19011 Primary osteoarthritis, right shoulder: Secondary | ICD-10-CM

## 2021-03-19 NOTE — Progress Notes (Signed)
Orthopaedic Clinic Return  Assessment: Whitney Watts is a 87 y.o. female with the following: Right shoulder arthritis Right knee arthritis Left ankle swelling   Plan: Patient has advanced degenerative changes of both the right glenohumeral joint, as well as the right knee joint.  She has had excellent response to steroid injection in the past, and is interested in a repeat injection in both of these joints today.  Her left ankle is swollen, without redness.  No x-rays are available, but the patient does not have any pain.  This could be medically related, or could be general swelling around the ankle.  At this point, I do not recommend any further treatment.  If the right shoulder injection is not beneficial, we could consider a guided glenohumeral joint injection, to be completed at the hospital.  All questions were answered she is amenable this plan.  Follow-up as needed.  Completed paperwork for a handicap placard in clinic today.  Procedure note injection Right knee joint   Verbal consent was obtained to inject the right knee joint  Timeout was completed to confirm the site of injection.  The skin was prepped with alcohol and ethyl chloride was sprayed at the injection site.  A 21-gauge needle was used to inject 40 mg of Depo-Medrol and 1% lidocaine (3 cc) into the right knee using an anterolateral approach.  There were no complications. A sterile bandage was applied.  Procedure note injection - Right shoulder    Verbal consent was obtained to inject the right shoulder, subacromial space Timeout was completed to confirm the site of injection.   The skin was prepped with alcohol and ethyl chloride was sprayed at the injection site.  A 21-gauge needle was used to inject 40 mg of Depo-Medrol and 1% lidocaine (3 cc) into the subacromial space of the right shoulder using a posterolateral approach.  There were no complications.  A sterile bandage was applied.    Follow-up: Return if  symptoms worsen or fail to improve.   Subjective:  Chief Complaint  Patient presents with   Injections    Right knee and right shoulder wants injections     History of Present Illness: Whitney Watts is a 87 y.o. female who returns to clinic for repeat evaluation of her right shoulder and right knee.  She has known arthritis of both joints.  She had an injection greater than 3 months ago, which provided excellent relief.  However, it is worn off.  In addition, she is having some swelling around her left ankle.  She is able to continue to walk with the assistance of a walker, without pain in the ankle.  However, she does have swelling which is concerning.  No redness around the ankle.  Review of Systems: No fevers or chills No numbness or tingling No chest pain No shortness of breath No bowel or bladder dysfunction No GI distress No headaches   Objective: BP 140/74   Pulse 85   Ht '5\' 4"'$  (1.626 m)   Wt 135 lb (61.2 kg)   BMI 23.17 kg/m   Physical Exam:  Elderly female.  No acute distress.  Alert and oriented.  Walks slowly, hunched posture using a walker.  Evaluation the right knee demonstrates a valgus alignment.  Good functional range of motion.  Tenderness palpation along the medial lateral joint lines.  Evaluation of the right shoulder demonstrates no deformity.  Some atrophy is appreciated over the posterior aspect of the shoulder.  Limited forward flexion,  but she is able to get her hand to the top of her head.  Good range of motion of the left ankle.  She does have some diffuse swelling about the ankle.  No redness.  No tenderness to palpation.  IMAGING: I personally ordered and reviewed the following images:  No imaging obtained today.  Mordecai Rasmussen, MD 03/19/2021 9:58 AM

## 2021-03-19 NOTE — Patient Instructions (Signed)

## 2021-04-04 DIAGNOSIS — E1165 Type 2 diabetes mellitus with hyperglycemia: Secondary | ICD-10-CM | POA: Diagnosis not present

## 2021-04-04 DIAGNOSIS — M1990 Unspecified osteoarthritis, unspecified site: Secondary | ICD-10-CM | POA: Diagnosis not present

## 2021-05-05 DIAGNOSIS — M1990 Unspecified osteoarthritis, unspecified site: Secondary | ICD-10-CM | POA: Diagnosis not present

## 2021-05-05 DIAGNOSIS — E1165 Type 2 diabetes mellitus with hyperglycemia: Secondary | ICD-10-CM | POA: Diagnosis not present

## 2021-05-13 ENCOUNTER — Other Ambulatory Visit: Payer: Self-pay | Admitting: Orthopedic Surgery

## 2021-06-04 DIAGNOSIS — E1165 Type 2 diabetes mellitus with hyperglycemia: Secondary | ICD-10-CM | POA: Diagnosis not present

## 2021-06-04 DIAGNOSIS — M1990 Unspecified osteoarthritis, unspecified site: Secondary | ICD-10-CM | POA: Diagnosis not present

## 2021-06-14 DIAGNOSIS — Z0000 Encounter for general adult medical examination without abnormal findings: Secondary | ICD-10-CM | POA: Diagnosis not present

## 2021-06-14 DIAGNOSIS — R269 Unspecified abnormalities of gait and mobility: Secondary | ICD-10-CM | POA: Diagnosis not present

## 2021-06-14 DIAGNOSIS — M25561 Pain in right knee: Secondary | ICD-10-CM | POA: Diagnosis not present

## 2021-06-14 DIAGNOSIS — Z23 Encounter for immunization: Secondary | ICD-10-CM | POA: Diagnosis not present

## 2021-06-14 DIAGNOSIS — I700 Atherosclerosis of aorta: Secondary | ICD-10-CM | POA: Diagnosis not present

## 2021-06-14 DIAGNOSIS — E1169 Type 2 diabetes mellitus with other specified complication: Secondary | ICD-10-CM | POA: Diagnosis not present

## 2021-06-14 DIAGNOSIS — G8929 Other chronic pain: Secondary | ICD-10-CM | POA: Diagnosis not present

## 2021-06-14 DIAGNOSIS — M545 Low back pain, unspecified: Secondary | ICD-10-CM | POA: Diagnosis not present

## 2021-06-14 DIAGNOSIS — Z0001 Encounter for general adult medical examination with abnormal findings: Secondary | ICD-10-CM | POA: Diagnosis not present

## 2021-06-14 DIAGNOSIS — I10 Essential (primary) hypertension: Secondary | ICD-10-CM | POA: Diagnosis not present

## 2021-06-14 DIAGNOSIS — M179 Osteoarthritis of knee, unspecified: Secondary | ICD-10-CM | POA: Diagnosis not present

## 2021-06-14 DIAGNOSIS — L210 Seborrhea capitis: Secondary | ICD-10-CM | POA: Diagnosis not present

## 2021-06-14 DIAGNOSIS — E782 Mixed hyperlipidemia: Secondary | ICD-10-CM | POA: Diagnosis not present

## 2021-06-25 ENCOUNTER — Other Ambulatory Visit: Payer: Self-pay

## 2021-06-25 ENCOUNTER — Encounter: Payer: Self-pay | Admitting: Orthopedic Surgery

## 2021-06-25 ENCOUNTER — Ambulatory Visit (INDEPENDENT_AMBULATORY_CARE_PROVIDER_SITE_OTHER): Payer: PPO | Admitting: Orthopedic Surgery

## 2021-06-25 DIAGNOSIS — M19011 Primary osteoarthritis, right shoulder: Secondary | ICD-10-CM | POA: Diagnosis not present

## 2021-06-25 DIAGNOSIS — M1711 Unilateral primary osteoarthritis, right knee: Secondary | ICD-10-CM

## 2021-06-25 NOTE — Progress Notes (Signed)
Orthopaedic Clinic Return  Assessment: Whitney Watts is a 87 y.o. female with the following: Right shoulder arthritis Right knee arthritis  Plan: Patient had excellent relief from previous right knee steroid injection.  She is requesting a repeat injection.  Regarding her right shoulder, we previously discussed an image guided right glenohumeral injection, and she would like to proceed.  An order was placed and the patient's daughter will help coordinate this procedure scheduling.   Procedure note injection Right knee joint   Verbal consent was obtained to inject the right knee joint  Timeout was completed to confirm the site of injection.  The skin was prepped with alcohol and ethyl chloride was sprayed at the injection site.  A 21-gauge needle was used to inject 40 mg of Depo-Medrol and 1% lidocaine (3 cc) into the right knee using an anterolateral approach.  There were no complications. A sterile bandage was applied.   Follow-up: Return if symptoms worsen or fail to improve.   Subjective:  Chief Complaint  Patient presents with   Knee Pain    Right     History of Present Illness: Whitney Watts is a 87 y.o. female who returns to clinic for repeat evaluation of her right knee.  Her last injection lasted several months.  Over the past 2 weeks she has noted worsening pain.  She would like another injection.  Her right shoulder was injected in clinic, but this did not provide sustained relief.   Review of Systems: No fevers or chills No numbness or tingling No chest pain No shortness of breath No bowel or bladder dysfunction No GI distress No headaches   Objective: There were no vitals taken for this visit.  Physical Exam:  Elderly female.  No acute distress.  Alert and oriented.  Walks slowly, hunched posture using a walker.  Evaluation the right knee demonstrates a valgus alignment.  Good functional range of motion.  Tenderness palpation along the lateral joint line.     IMAGING: I personally ordered and reviewed the following images:  No imaging obtained today.  Mordecai Rasmussen, MD 06/25/2021 11:05 PM

## 2021-06-25 NOTE — Patient Instructions (Signed)

## 2021-06-30 ENCOUNTER — Telehealth: Payer: Self-pay | Admitting: Orthopedic Surgery

## 2021-06-30 NOTE — Telephone Encounter (Signed)
Returned call, daughter wanted to know if pt was to receive any type of imaging with the scan. Let her know that was not necessary and she just needs appointment.

## 2021-06-30 NOTE — Telephone Encounter (Signed)
Call received - question about order for injection to be done at hospital - patient's daughter/designated contact on file, Jana Half - said when calling [facility] scheduling, she was asked questions about the order. Please advise. [phone]

## 2021-07-05 DIAGNOSIS — E782 Mixed hyperlipidemia: Secondary | ICD-10-CM | POA: Diagnosis not present

## 2021-07-05 DIAGNOSIS — I10 Essential (primary) hypertension: Secondary | ICD-10-CM | POA: Diagnosis not present

## 2021-07-14 ENCOUNTER — Other Ambulatory Visit: Payer: Self-pay

## 2021-07-14 ENCOUNTER — Encounter (HOSPITAL_COMMUNITY): Payer: Self-pay

## 2021-07-14 ENCOUNTER — Ambulatory Visit (HOSPITAL_COMMUNITY)
Admission: RE | Admit: 2021-07-14 | Discharge: 2021-07-14 | Disposition: A | Discharge status: Home / Self Care | Payer: PPO | Source: Ambulatory Visit | Attending: Orthopedic Surgery | Admitting: Orthopedic Surgery

## 2021-07-14 DIAGNOSIS — M25511 Pain in right shoulder: Secondary | ICD-10-CM | POA: Diagnosis not present

## 2021-07-14 DIAGNOSIS — M19011 Primary osteoarthritis, right shoulder: Secondary | ICD-10-CM | POA: Insufficient documentation

## 2021-07-14 DIAGNOSIS — G8929 Other chronic pain: Secondary | ICD-10-CM | POA: Diagnosis not present

## 2021-07-14 MED ORDER — POVIDONE-IODINE 10 % EX SOLN
CUTANEOUS | Status: AC
  Administered 2021-07-14: 1
  Filled 2021-07-14: qty 15

## 2021-07-14 MED ORDER — BUPIVACAINE HCL (PF) 0.5 % IJ SOLN
INTRAMUSCULAR | Status: AC
  Administered 2021-07-14: 12:00:00 3 mL
  Filled 2021-07-14: qty 30

## 2021-07-14 MED ORDER — IOHEXOL 180 MG/ML  SOLN
20.0000 mL | Freq: Once | INTRAMUSCULAR | Status: AC | PRN
  Administered 2021-07-14: 12:00:00 2 mL via INTRA_ARTICULAR

## 2021-07-14 MED ORDER — LIDOCAINE HCL (PF) 1 % IJ SOLN
INTRAMUSCULAR | Status: AC
  Administered 2021-07-14: 12:00:00 2 mL
  Filled 2021-07-14: qty 5

## 2021-07-14 MED ORDER — METHYLPREDNISOLONE ACETATE 40 MG/ML IJ SUSP
INTRAMUSCULAR | Status: AC
  Administered 2021-07-14: 12:00:00 40 mg
  Filled 2021-07-14: qty 1

## 2021-07-14 NOTE — Procedures (Signed)
Preprocedure Dx: RT shoulder pain, glenohumeral arthritis RT Postprocedure Dx: RT shoulder pain, glenohumeral arthritis RT Procedure  Fluoroscopically guided therapeutic RT shoulder joint injection f Radiologist:  Thornton Papas Anesthesia:  2 ml of 1% lidocaine Injectate:  3 ml 0.5% sensorcaine, 40mg  DepoMedrol Fluoro time:  0 minutes 24 seconds EBL:   None Complications: None

## 2021-08-04 DIAGNOSIS — I10 Essential (primary) hypertension: Secondary | ICD-10-CM | POA: Diagnosis not present

## 2021-08-04 DIAGNOSIS — E782 Mixed hyperlipidemia: Secondary | ICD-10-CM | POA: Diagnosis not present

## 2021-08-10 ENCOUNTER — Other Ambulatory Visit: Payer: Self-pay | Admitting: Orthopedic Surgery

## 2021-09-03 DIAGNOSIS — E782 Mixed hyperlipidemia: Secondary | ICD-10-CM | POA: Diagnosis not present

## 2021-09-03 DIAGNOSIS — I10 Essential (primary) hypertension: Secondary | ICD-10-CM | POA: Diagnosis not present

## 2021-09-24 ENCOUNTER — Encounter: Payer: Self-pay | Admitting: Orthopedic Surgery

## 2021-09-24 ENCOUNTER — Other Ambulatory Visit: Payer: Self-pay

## 2021-09-24 ENCOUNTER — Ambulatory Visit (INDEPENDENT_AMBULATORY_CARE_PROVIDER_SITE_OTHER): Payer: PPO | Admitting: Orthopedic Surgery

## 2021-09-24 VITALS — Ht 64.0 in | Wt 135.0 lb

## 2021-09-24 DIAGNOSIS — M1711 Unilateral primary osteoarthritis, right knee: Secondary | ICD-10-CM

## 2021-09-24 NOTE — Patient Instructions (Signed)

## 2021-09-24 NOTE — Progress Notes (Signed)
Orthopaedic Clinic Return  Assessment: Whitney Watts is a 88 y.o. female with the following: Right shoulder arthritis Right knee arthritis  Plan: Patient had excellent relief from previous right knee steroid injection.  She would like another injection.  Continue with medications as needed.  No restrictions.  Regarding her right shoulder, it is too early to proceed with another injection.  If she would like to proceed with an image guided injection, she will contact the clinic.  Reasonable to adjust her seated position, and or stop painting in order to improve the pain she is experiencing in the upper arm.   Procedure note injection Right knee joint   Verbal consent was obtained to inject the right knee joint  Timeout was completed to confirm the site of injection.  The skin was prepped with alcohol and ethyl chloride was sprayed at the injection site.  A 21-gauge needle was used to inject 40 mg of Depo-Medrol and 1% lidocaine (3 cc) into the right knee using an anterolateral approach.  There were no complications. A sterile bandage was applied.   Follow-up: Return if symptoms worsen or fail to improve.   Subjective:  Chief Complaint  Patient presents with   Knee Pain    Rt knee wants injection   Medication Refill    Tramadol   Shoulder Pain    History of Present Illness: Whitney Watts is a 88 y.o. female who returns to clinic for repeat evaluation of her right knee.  The last injection was greater than 3 months ago.  She had excellent relief.  She is also had right shoulder injection, completed at [facility], also with excellent relief.  She has no pain in her right shoulder, but she has limited range of motion above the level of her shoulder.  She also notes that she recently had some pain in the posterior aspect of the upper arm.  She has been taking Tylenol.  She is also been doing a lot of painting  Review of Systems: No fevers or chills No numbness or tingling No chest  pain No shortness of breath No bowel or bladder dysfunction No GI distress No headaches   Objective: Ht 5\' 4"  (1.626 m)    Wt 135 lb (61.2 kg)    BMI 23.17 kg/m   Physical Exam:  Elderly female.  No acute distress.  Alert and oriented.  Walks slowly, hunched posture using a walker.  Evaluation the right knee demonstrates a valgus alignment.  Good functional range of motion.  Tenderness palpation along the lateral joint line.    IMAGING: I personally ordered and reviewed the following images:  No imaging obtained today.  Mordecai Rasmussen, MD 09/24/2021 11:12 AM

## 2021-10-05 DIAGNOSIS — I10 Essential (primary) hypertension: Secondary | ICD-10-CM | POA: Diagnosis not present

## 2021-10-05 DIAGNOSIS — E785 Hyperlipidemia, unspecified: Secondary | ICD-10-CM | POA: Diagnosis not present

## 2021-10-12 ENCOUNTER — Telehealth: Payer: Self-pay | Admitting: Radiology

## 2021-10-12 DIAGNOSIS — M19011 Primary osteoarthritis, right shoulder: Secondary | ICD-10-CM

## 2021-10-12 NOTE — Telephone Encounter (Signed)
Order placed and daughter notified.

## 2021-10-12 NOTE — Telephone Encounter (Signed)
Daughter called, said they are ready to schedule patient for a right shld injection at [facility].  Please enter the order?  Please also call her on her cell and advise once order is entered so she can call and schedule it.  Thanks.

## 2021-11-05 ENCOUNTER — Encounter (HOSPITAL_COMMUNITY): Payer: Self-pay

## 2021-11-05 ENCOUNTER — Ambulatory Visit (HOSPITAL_COMMUNITY)
Admission: RE | Admit: 2021-11-05 | Discharge: 2021-11-05 | Disposition: A | Discharge status: Home / Self Care | Payer: PPO | Source: Ambulatory Visit | Attending: Orthopedic Surgery | Admitting: Orthopedic Surgery

## 2021-11-05 ENCOUNTER — Other Ambulatory Visit: Payer: Self-pay

## 2021-11-05 DIAGNOSIS — M25511 Pain in right shoulder: Secondary | ICD-10-CM | POA: Diagnosis not present

## 2021-11-05 DIAGNOSIS — M19011 Primary osteoarthritis, right shoulder: Secondary | ICD-10-CM | POA: Diagnosis present

## 2021-11-05 DIAGNOSIS — G8929 Other chronic pain: Secondary | ICD-10-CM | POA: Diagnosis not present

## 2021-11-05 MED ORDER — BUPIVACAINE HCL (PF) 0.5 % IJ SOLN
INTRAMUSCULAR | Status: AC
  Administered 2021-11-05: 5 mL
  Filled 2021-11-05: qty 30

## 2021-11-05 MED ORDER — IOHEXOL 180 MG/ML  SOLN
20.0000 mL | Freq: Once | INTRAMUSCULAR | Status: AC | PRN
  Administered 2021-11-05: 3 mL

## 2021-11-05 MED ORDER — LIDOCAINE HCL (PF) 1 % IJ SOLN
INTRAMUSCULAR | Status: AC
  Administered 2021-11-05: 5 mL
  Filled 2021-11-05: qty 5

## 2021-11-05 MED ORDER — POVIDONE-IODINE 10 % EX SOLN
CUTANEOUS | Status: AC
  Administered 2021-11-05: 1
  Filled 2021-11-05: qty 14.8

## 2021-11-05 MED ORDER — METHYLPREDNISOLONE ACETATE 40 MG/ML IJ SUSP
INTRAMUSCULAR | Status: AC
  Administered 2021-11-05: 40 mg
  Filled 2021-11-05: qty 1

## 2021-11-05 NOTE — Procedures (Signed)
Preprocedure Dx: Chronic right shoulder pain ?Postprocedure Dx: Chronic right shoulder pain ?Procedure  Fluoroscopically guided therapeutic right joint injection ?Radiologist:  Thornton Papas ?Anesthesia:  53ml ml of 1% lidocaine ?Injectate:  40 mg DepoMedrol, 3 ml Snsorcaine 0.5% ?Fluoro time:  0 minutes 30 seconds ?EBL:   None ?Complications: None  ?

## 2021-11-10 ENCOUNTER — Other Ambulatory Visit: Payer: Self-pay | Admitting: Orthopedic Surgery

## 2021-11-22 DIAGNOSIS — R5381 Other malaise: Secondary | ICD-10-CM | POA: Diagnosis not present

## 2021-11-22 DIAGNOSIS — R69 Illness, unspecified: Secondary | ICD-10-CM | POA: Diagnosis not present

## 2021-11-23 ENCOUNTER — Other Ambulatory Visit: Payer: Self-pay

## 2021-11-23 ENCOUNTER — Emergency Department (HOSPITAL_COMMUNITY): Payer: PPO

## 2021-11-23 ENCOUNTER — Encounter (HOSPITAL_COMMUNITY): Payer: Self-pay

## 2021-11-23 ENCOUNTER — Emergency Department (HOSPITAL_COMMUNITY)
Admission: EM | Admit: 2021-11-23 | Discharge: 2021-11-23 | Disposition: A | Discharge status: Home / Self Care | Payer: PPO | Attending: Emergency Medicine | Admitting: Emergency Medicine

## 2021-11-23 DIAGNOSIS — W01198A Fall on same level from slipping, tripping and stumbling with subsequent striking against other object, initial encounter: Secondary | ICD-10-CM | POA: Insufficient documentation

## 2021-11-23 DIAGNOSIS — W19XXXA Unspecified fall, initial encounter: Secondary | ICD-10-CM

## 2021-11-23 DIAGNOSIS — M25511 Pain in right shoulder: Secondary | ICD-10-CM | POA: Diagnosis not present

## 2021-11-23 DIAGNOSIS — R519 Headache, unspecified: Secondary | ICD-10-CM | POA: Insufficient documentation

## 2021-11-23 DIAGNOSIS — Z7984 Long term (current) use of oral hypoglycemic drugs: Secondary | ICD-10-CM | POA: Insufficient documentation

## 2021-11-23 DIAGNOSIS — M542 Cervicalgia: Secondary | ICD-10-CM | POA: Insufficient documentation

## 2021-11-23 DIAGNOSIS — S0990XA Unspecified injury of head, initial encounter: Secondary | ICD-10-CM | POA: Diagnosis not present

## 2021-11-23 DIAGNOSIS — E119 Type 2 diabetes mellitus without complications: Secondary | ICD-10-CM | POA: Insufficient documentation

## 2021-11-23 DIAGNOSIS — I10 Essential (primary) hypertension: Secondary | ICD-10-CM | POA: Diagnosis not present

## 2021-11-23 DIAGNOSIS — M898X1 Other specified disorders of bone, shoulder: Secondary | ICD-10-CM

## 2021-11-23 DIAGNOSIS — Z79899 Other long term (current) drug therapy: Secondary | ICD-10-CM | POA: Insufficient documentation

## 2021-11-23 DIAGNOSIS — Z043 Encounter for examination and observation following other accident: Secondary | ICD-10-CM | POA: Diagnosis not present

## 2021-11-23 MED ORDER — TRAMADOL HCL 50 MG PO TABS
50.0000 mg | ORAL_TABLET | Freq: Once | ORAL | Status: AC
  Administered 2021-11-23: 50 mg via ORAL
  Filled 2021-11-23: qty 1

## 2021-11-23 NOTE — ED Provider Notes (Signed)
?[facility] ?Provider Note ? ? ?CSN: [id number] ?Arrival date & time: 11/23/21  0945 ? ?  ? ?History ? ?Chief Complaint  ?Patient presents with  ? Fall  ? ? ?Whitney Watts is a 88 y.o. female. ? ? ?Fall ?Pertinent negatives include no chest pain, no abdominal pain, no headaches and no shortness of breath.  ? ?  ? ?Whitney Watts is a 88 y.o. female with past medical history significant for type 2 diabetes, hypertension and osteoarthritis who presents to the Emergency Department complaining of pain of her right shoulder and clavicle.  She describes a mechanical fall that occurred yesterday.  She was coming back inside the home from the back porch when she bent over to pick something up and fell back.  She states that she struck the right side of her head on a cabinet and fell on her right shoulder.  EMS was called and assisted her up.  She states that she ambulated around the home and felt "okay" this morning, she woke with worsening pain of her shoulder and pain to the proximal clavicle area.  Pain of the shoulder is worse with movement.  He is also having some soreness of the right neck.  She denies headache, dizziness, LOC, low back or hip pain.  No chest pain or shortness of breath.  Does not take blood thinners. ? ?Home Medications ?Prior to Admission medications   ?Medication Sig Start Date End Date Taking? Authorizing Provider  ?acetaminophen (TYLENOL) 500 MG tablet Take 500 mg by mouth every 6 (six) hours as needed.    [provider]  ?amLODipine (NORVASC) 10 MG tablet Take 10 mg by mouth daily.    [provider]  ?hydrochlorothiazide (HYDRODIURIL) 12.5 MG tablet Take 12.5 mg by mouth every morning. 04/23/20   [provider]  ?losartan (COZAAR) 100 MG tablet Take 100 mg by mouth every morning. 04/23/20   [provider]  ?metFORMIN (GLUCOPHAGE-XR) 500 MG 24 hr tablet Take 1,000 mg by mouth 2 (two) times daily. 04/23/20   [provider]   ?traMADol (ULTRAM) 50 MG tablet TAKE ONE TABLET BY MOUTH EVERY 6 HOURS AS NEEDED 11/10/21   Mordecai Rasmussen, MD  ?   ? ?Allergies    ?Bee venom, Pravastatin sodium, and Rosuvastatin   ? ?Review of Systems   ?Review of Systems  ?Eyes:  Negative for visual disturbance.  ?Respiratory:  Negative for shortness of breath.   ?Cardiovascular:  Negative for chest pain.  ?Gastrointestinal:  Negative for abdominal pain, nausea and vomiting.  ?Genitourinary:  Negative for dysuria and flank pain.  ?Musculoskeletal:  Positive for arthralgias (Right shoulder and clavicle pain) and neck pain.  ?Skin:  Negative for color change, rash and wound.  ?Neurological:  Negative for dizziness, syncope, weakness, numbness and headaches.  ?All other systems reviewed and are negative. ? ?Physical Exam ?Updated Vital Signs ?BP 128/85   Pulse 78   Temp 98 ?F (36.7 ?C) (Oral)   Resp 18   Ht 5\' 4"  (1.626 m)   Wt 63.5 kg   SpO2 98%   BMI 24.03 kg/m?  ?Physical Exam ?Vitals and nursing note reviewed.  ?Constitutional:   ?   General: She is not in acute distress. ?   Appearance: Normal appearance. She is not ill-appearing or toxic-appearing.  ?HENT:  ?   Head:  ?   Comments: No abrasions or scalp hematomas on exam ?   Right Ear: Tympanic membrane and ear canal  normal.  ?   Left Ear: Tympanic membrane and ear canal normal.  ?   Ears:  ?   Comments: No hemotympanum ?   Mouth/Throat:  ?   Mouth: Mucous membranes are moist.  ?Eyes:  ?   Conjunctiva/sclera: Conjunctivae normal.  ?   Pupils: Pupils are equal, round, and reactive to light.  ?Neck:  ?   Comments: Tenderness palpation of the right cervical paraspinal muscles.  No midline tenderness or bony step-offs. ?Cardiovascular:  ?   Rate and Rhythm: Normal rate and regular rhythm.  ?   Pulses: Normal pulses.  ?Pulmonary:  ?   Effort: Pulmonary effort is normal.  ?   Breath sounds: Normal breath sounds.  ?Chest:  ?   Chest wall: No tenderness.  ?Abdominal:  ?   Palpations: Abdomen is soft.  ?    Tenderness: There is no abdominal tenderness. There is no guarding.  ?Musculoskeletal:  ?   Cervical back: Tenderness present.  ?Skin: ?   General: Skin is warm.  ?   Capillary Refill: Capillary refill takes less than 2 seconds.  ?   Findings: No bruising, erythema or rash.  ?Neurological:  ?   General: No focal deficit present.  ?   Mental Status: She is alert.  ?   GCS: GCS eye subscore is 4. GCS verbal subscore is 5. GCS motor subscore is 6.  ?   Sensory: Sensation is intact. No sensory deficit.  ?   Motor: Motor function is intact. No weakness.  ?   Coordination: Coordination is intact.  ? ? ?ED Results / Procedures / Treatments   ?Labs ?(all labs ordered are listed, but only abnormal results are displayed) ?Labs Reviewed - No data to display ? ?EKG ?EKG Interpretation ? ?Date/Time:  Tuesday November 23 2021 10:53:05 EDT ?Ventricular Rate:  70 ?PR Interval:  173 ?QRS Duration: 109 ?QT Interval:  382 ?QTC Calculation: 413 ?R Axis:   -38 ?Text Interpretation: Sinus rhythm Atrial premature complex Incomplete left bundle branch block LVH with secondary repolarization abnormality Anterior Q waves, possibly due to LVH Since prior ECG< rate has slowed Confirmed by Gareth Morgan [phone]) on 11/24/2021 11:15:35 PM ? ?Radiology ?DG Clavicle Right ? ?Result Date: 11/23/2021 ?CLINICAL DATA:  Fall yesterday. EXAM: RIGHT SHOULDER - 2+ VIEW; RIGHT CLAVICLE - 2+ VIEWS COMPARISON:  Right shoulder radiographs 10/30/2020 FINDINGS: There is again diffuse decreased bone mineralization. There is again severe glenohumeral joint space narrowing and large inferior humeral head-neck junction degenerative osteophytosis. Mild acromioclavicular joint space narrowing with moderate peripheral degenerative osteophytosis. The clavicular head is again approximately 5 mm superiorly located with respect to the acromion chronic. No acute fracture or dislocation. No acute fracture is identified within the right clavicle. The visualized portion of the  right lung is unremarkable. Moderate calcifications within the thoracic aorta. IMPRESSION:: IMPRESSION: 1. Within the limitations of diffuse decreased bone mineralization, no acute fracture is identified. 2. Severe glenohumeral and mild acromioclavicular osteoarthritis. 3. Unchanged mild elevation of the clavicular head with respect to the acromion, chronic and possibly degenerative. Electronically Signed   By: Yvonne Kendall M.D.   On: 11/23/2021 12:09  ? ?DG Shoulder Right ? ?Result Date: 11/23/2021 ?CLINICAL DATA:  Fall yesterday. EXAM: RIGHT SHOULDER - 2+ VIEW; RIGHT CLAVICLE - 2+ VIEWS COMPARISON:  Right shoulder radiographs 10/30/2020 FINDINGS: There is again diffuse decreased bone mineralization. There is again severe glenohumeral joint space narrowing and large inferior humeral head-neck junction degenerative osteophytosis. Mild acromioclavicular joint space narrowing with  moderate peripheral degenerative osteophytosis. The clavicular head is again approximately 5 mm superiorly located with respect to the acromion chronic. No acute fracture or dislocation. No acute fracture is identified within the right clavicle. The visualized portion of the right lung is unremarkable. Moderate calcifications within the thoracic aorta. IMPRESSION:: IMPRESSION: 1. Within the limitations of diffuse decreased bone mineralization, no acute fracture is identified. 2. Severe glenohumeral and mild acromioclavicular osteoarthritis. 3. Unchanged mild elevation of the clavicular head with respect to the acromion, chronic and possibly degenerative. Electronically Signed   By: Yvonne Kendall M.D.   On: 11/23/2021 12:09  ? ?CT Head Wo Contrast ? ?Result Date: 11/23/2021 ?CLINICAL DATA:  Fall yesterday, hit head EXAM: CT HEAD WITHOUT CONTRAST TECHNIQUE: Contiguous axial images were obtained from the base of the skull through the vertex without intravenous contrast. RADIATION DOSE REDUCTION: This exam was performed according to the  departmental dose-optimization program which includes automated exposure control, adjustment of the mA and/or kV according to patient size and/or use of iterative reconstruction technique. COMPARISON:  CT head 06/17/2020 FINDINGS:

## 2021-11-23 NOTE — Discharge Instructions (Addendum)
You have some significant arthritis of your right shoulder and clavicle area, but no evidence of broken bones or new dislocations on x-rays today.  I recommend that you continue your tramadol as directed for pain.  You may apply over-the-counter Voltaren gel to your shoulder and clavicle area.  Please follow-up with your primary care provider or with your orthopedic provider for recheck.  Return to the emergency department for any new or worsening symptoms. ?

## 2021-11-23 NOTE — ED Triage Notes (Signed)
Pt walking in from her back porch with walker yesterday, turned around, and fell.  C/O pain to r shoulder and r clavicle.  Denies hitting head.  Reports EMS was called out and helped her up.   ?

## 2021-12-03 DIAGNOSIS — M1990 Unspecified osteoarthritis, unspecified site: Secondary | ICD-10-CM | POA: Diagnosis not present

## 2021-12-03 DIAGNOSIS — E1165 Type 2 diabetes mellitus with hyperglycemia: Secondary | ICD-10-CM | POA: Diagnosis not present

## 2022-01-03 DIAGNOSIS — E138 Other specified diabetes mellitus with unspecified complications: Secondary | ICD-10-CM | POA: Insufficient documentation

## 2022-01-03 DIAGNOSIS — R269 Unspecified abnormalities of gait and mobility: Secondary | ICD-10-CM | POA: Insufficient documentation

## 2022-01-04 ENCOUNTER — Telehealth: Payer: Self-pay | Admitting: Radiology

## 2022-01-04 DIAGNOSIS — L210 Seborrhea capitis: Secondary | ICD-10-CM | POA: Diagnosis not present

## 2022-01-04 DIAGNOSIS — L989 Disorder of the skin and subcutaneous tissue, unspecified: Secondary | ICD-10-CM | POA: Diagnosis not present

## 2022-01-04 DIAGNOSIS — M545 Low back pain, unspecified: Secondary | ICD-10-CM | POA: Diagnosis not present

## 2022-01-04 DIAGNOSIS — E1169 Type 2 diabetes mellitus with other specified complication: Secondary | ICD-10-CM | POA: Diagnosis not present

## 2022-01-04 DIAGNOSIS — E782 Mixed hyperlipidemia: Secondary | ICD-10-CM | POA: Diagnosis not present

## 2022-01-04 DIAGNOSIS — I10 Essential (primary) hypertension: Secondary | ICD-10-CM | POA: Diagnosis not present

## 2022-01-04 DIAGNOSIS — M179 Osteoarthritis of knee, unspecified: Secondary | ICD-10-CM | POA: Diagnosis not present

## 2022-01-04 DIAGNOSIS — I700 Atherosclerosis of aorta: Secondary | ICD-10-CM | POA: Diagnosis not present

## 2022-01-04 DIAGNOSIS — R269 Unspecified abnormalities of gait and mobility: Secondary | ICD-10-CM | POA: Diagnosis not present

## 2022-01-04 DIAGNOSIS — Z741 Need for assistance with personal care: Secondary | ICD-10-CM | POA: Diagnosis not present

## 2022-01-04 NOTE — Telephone Encounter (Signed)
Patient's daughter called and scheduled an appt for another knee injection.  Wants to schedule an appt at hospital for another shoulder injection on same day.  Please enter order and she is expecting them to call her to schedule.  Thanks. ?

## 2022-01-19 ENCOUNTER — Telehealth: Payer: Self-pay | Admitting: Orthopedic Surgery

## 2022-01-19 DIAGNOSIS — M19011 Primary osteoarthritis, right shoulder: Secondary | ICD-10-CM

## 2022-01-19 NOTE — Telephone Encounter (Signed)
Last injection 11/05/21, please advise.  ?

## 2022-01-19 NOTE — Telephone Encounter (Signed)
Patient's daughter and designated contact, Jana Half, [phone] cell number, or [phone] home number - left a voice message regarding an order for [facility] for an injection for shoulder? Please advise. ?

## 2022-01-20 NOTE — Telephone Encounter (Signed)
Called pt daughter and let her know the injection is a little early. She states she's aware and trying to make sure she can get it scheduled in advance so she can take pt to appointment. Order placed at this time.

## 2022-02-02 ENCOUNTER — Ambulatory Visit (INDEPENDENT_AMBULATORY_CARE_PROVIDER_SITE_OTHER): Payer: PPO | Admitting: Orthopedic Surgery

## 2022-02-02 DIAGNOSIS — M1711 Unilateral primary osteoarthritis, right knee: Secondary | ICD-10-CM | POA: Diagnosis not present

## 2022-02-02 DIAGNOSIS — I10 Essential (primary) hypertension: Secondary | ICD-10-CM | POA: Diagnosis not present

## 2022-02-02 DIAGNOSIS — E782 Mixed hyperlipidemia: Secondary | ICD-10-CM | POA: Diagnosis not present

## 2022-02-02 DIAGNOSIS — E1169 Type 2 diabetes mellitus with other specified complication: Secondary | ICD-10-CM | POA: Diagnosis not present

## 2022-02-02 NOTE — Patient Instructions (Signed)

## 2022-02-02 NOTE — Progress Notes (Signed)
[facility] Return  Assessment: Whitney Watts is a 88 y.o. female with the following: Right knee arthritis  Plan: Mrs. Hinostroza has had excellent relief from prior injections.  This has improved her knee pain until the last couple weeks.  She would like to proceed with another steroid injection.  This was completed in clinic today without issue.   Procedure note injection Right knee joint   Verbal consent was obtained to inject the right knee joint  Timeout was completed to confirm the site of injection.  The skin was prepped with alcohol and ethyl chloride was sprayed at the injection site.  A 21-gauge needle was used to inject 40 mg of Depo-Medrol and 1% lidocaine (3 cc) into the right knee using an anterolateral approach.  There were no complications. A sterile bandage was applied.   Follow-up: Return if symptoms worsen or fail to improve.   Subjective:  Chief Complaint  Patient presents with   Knee Pain    RT wants injection   Injections    RT knee    History of Present Illness: Whitney Watts is a 88 y.o. female who returns to clinic for repeat evaluation of her right knee.  Right knee was last injected greater than 3 months ago.  He has provided excellent relief of her symptoms for greater than 2 months.  The pain is returned.  She notes pain radiating distally from her knee.  Pain gets worse at night.  She continues to use a brace.  Review of Systems: No fevers or chills No numbness or tingling No chest pain No shortness of breath No bowel or bladder dysfunction No GI distress No headaches   Objective: There were no vitals taken for this visit.  Physical Exam:  Elderly female.  No acute distress.  Alert and oriented.  Walks slowly, hunched posture using a walker.  Evaluation the right knee demonstrates a valgus alignment.  Good functional range of motion.  Tenderness palpation along the lateral joint line.    IMAGING: I personally ordered and  reviewed the following images:  No imaging obtained today.  Mordecai Rasmussen, MD 02/02/2022 9:30 AM

## 2022-02-04 ENCOUNTER — Ambulatory Visit: Payer: PPO | Admitting: Orthopedic Surgery

## 2022-02-04 ENCOUNTER — Encounter (HOSPITAL_COMMUNITY): Payer: Self-pay

## 2022-02-04 ENCOUNTER — Ambulatory Visit (HOSPITAL_COMMUNITY)
Admission: RE | Admit: 2022-02-04 | Discharge: 2022-02-04 | Disposition: A | Discharge status: Home / Self Care | Payer: PPO | Source: Ambulatory Visit | Attending: Orthopedic Surgery | Admitting: Orthopedic Surgery

## 2022-02-04 DIAGNOSIS — M19011 Primary osteoarthritis, right shoulder: Secondary | ICD-10-CM | POA: Diagnosis not present

## 2022-02-04 MED ORDER — LIDOCAINE HCL (PF) 1 % IJ SOLN
INTRAMUSCULAR | Status: AC
  Administered 2022-02-04: 5 mL
  Filled 2022-02-04: qty 5

## 2022-02-04 MED ORDER — METHYLPREDNISOLONE ACETATE 40 MG/ML IJ SUSP
INTRAMUSCULAR | Status: AC
  Administered 2022-02-04: 40 mg
  Filled 2022-02-04: qty 1

## 2022-02-04 MED ORDER — TRIAMCINOLONE ACETONIDE 40 MG/ML IJ SUSP
INTRAMUSCULAR | Status: AC
  Filled 2022-02-04: qty 1

## 2022-02-04 MED ORDER — BUPIVACAINE HCL (PF) 0.5 % IJ SOLN
INTRAMUSCULAR | Status: AC
  Administered 2022-02-04: 5 mL
  Filled 2022-02-04: qty 30

## 2022-02-04 MED ORDER — POVIDONE-IODINE 10 % EX SOLN
CUTANEOUS | Status: AC
  Administered 2022-02-04: 1
  Filled 2022-02-04: qty 14.8

## 2022-02-04 NOTE — Procedures (Signed)
Preprocedure Dx: Glenohumeral arthritis, right Postprocedure Dx: Glenohumeral arthritis, right Procedure  Fluoroscopically guided therapeutic right shoulderjoint injection Radiologist:  Thornton Papas Anesthesia:  3 ml of 1% lidocaine Injectate:  40 mg DepoMedrol, 3 ml Sensorcaine 0.5% Dose:  0.9 mGy EBL:   None Complications: None

## 2022-02-21 DIAGNOSIS — E1151 Type 2 diabetes mellitus with diabetic peripheral angiopathy without gangrene: Secondary | ICD-10-CM | POA: Diagnosis not present

## 2022-02-21 DIAGNOSIS — B351 Tinea unguium: Secondary | ICD-10-CM | POA: Diagnosis not present

## 2022-02-22 ENCOUNTER — Emergency Department (HOSPITAL_COMMUNITY): Payer: PPO

## 2022-02-22 ENCOUNTER — Other Ambulatory Visit: Payer: Self-pay

## 2022-02-22 ENCOUNTER — Observation Stay (HOSPITAL_COMMUNITY)
Admission: EM | Admit: 2022-02-22 | Discharge: 2022-02-23 | Disposition: A | Discharge status: Home Health | Payer: PPO | Attending: Family Medicine | Admitting: Family Medicine

## 2022-02-22 ENCOUNTER — Encounter (HOSPITAL_COMMUNITY): Payer: Self-pay | Admitting: *Deleted

## 2022-02-22 DIAGNOSIS — Z79899 Other long term (current) drug therapy: Secondary | ICD-10-CM | POA: Insufficient documentation

## 2022-02-22 DIAGNOSIS — K5900 Constipation, unspecified: Secondary | ICD-10-CM | POA: Insufficient documentation

## 2022-02-22 DIAGNOSIS — E119 Type 2 diabetes mellitus without complications: Secondary | ICD-10-CM | POA: Diagnosis not present

## 2022-02-22 DIAGNOSIS — K450 Other specified abdominal hernia with obstruction, without gangrene: Secondary | ICD-10-CM | POA: Diagnosis not present

## 2022-02-22 DIAGNOSIS — Z833 Family history of diabetes mellitus: Secondary | ICD-10-CM | POA: Insufficient documentation

## 2022-02-22 DIAGNOSIS — I10 Essential (primary) hypertension: Secondary | ICD-10-CM

## 2022-02-22 DIAGNOSIS — R1084 Generalized abdominal pain: Secondary | ICD-10-CM | POA: Diagnosis not present

## 2022-02-22 DIAGNOSIS — R2689 Other abnormalities of gait and mobility: Secondary | ICD-10-CM | POA: Diagnosis not present

## 2022-02-22 DIAGNOSIS — K460 Unspecified abdominal hernia with obstruction, without gangrene: Secondary | ICD-10-CM | POA: Diagnosis present

## 2022-02-22 DIAGNOSIS — R2681 Unsteadiness on feet: Secondary | ICD-10-CM | POA: Diagnosis not present

## 2022-02-22 DIAGNOSIS — K8020 Calculus of gallbladder without cholecystitis without obstruction: Secondary | ICD-10-CM | POA: Insufficient documentation

## 2022-02-22 DIAGNOSIS — R10819 Abdominal tenderness, unspecified site: Secondary | ICD-10-CM | POA: Diagnosis not present

## 2022-02-22 DIAGNOSIS — Z7984 Long term (current) use of oral hypoglycemic drugs: Secondary | ICD-10-CM | POA: Insufficient documentation

## 2022-02-22 DIAGNOSIS — K6389 Other specified diseases of intestine: Secondary | ICD-10-CM | POA: Diagnosis not present

## 2022-02-22 DIAGNOSIS — R1031 Right lower quadrant pain: Secondary | ICD-10-CM | POA: Diagnosis not present

## 2022-02-22 DIAGNOSIS — I700 Atherosclerosis of aorta: Secondary | ICD-10-CM | POA: Diagnosis not present

## 2022-02-22 DIAGNOSIS — K439 Ventral hernia without obstruction or gangrene: Secondary | ICD-10-CM | POA: Diagnosis not present

## 2022-02-22 DIAGNOSIS — E871 Hypo-osmolality and hyponatremia: Secondary | ICD-10-CM | POA: Diagnosis not present

## 2022-02-22 DIAGNOSIS — E876 Hypokalemia: Secondary | ICD-10-CM | POA: Diagnosis not present

## 2022-02-22 DIAGNOSIS — K430 Incisional hernia with obstruction, without gangrene: Secondary | ICD-10-CM | POA: Diagnosis not present

## 2022-02-22 DIAGNOSIS — R7989 Other specified abnormal findings of blood chemistry: Secondary | ICD-10-CM | POA: Diagnosis not present

## 2022-02-22 DIAGNOSIS — K420 Umbilical hernia with obstruction, without gangrene: Secondary | ICD-10-CM | POA: Insufficient documentation

## 2022-02-22 DIAGNOSIS — M6281 Muscle weakness (generalized): Secondary | ICD-10-CM | POA: Insufficient documentation

## 2022-02-22 DIAGNOSIS — N281 Cyst of kidney, acquired: Secondary | ICD-10-CM | POA: Diagnosis not present

## 2022-02-22 LAB — COMPREHENSIVE METABOLIC PANEL
ALT: 12 U/L (ref 0–44)
AST: 15 U/L (ref 15–41)
Albumin: 3.5 g/dL (ref 3.5–5.0)
Alkaline Phosphatase: 71 U/L (ref 38–126)
Anion gap: 10 (ref 5–15)
BUN: 16 mg/dL (ref 8–23)
CO2: 22 mmol/L (ref 22–32)
Calcium: 9 mg/dL (ref 8.9–10.3)
Chloride: 100 mmol/L (ref 98–111)
Creatinine, Ser: 0.55 mg/dL (ref 0.44–1.00)
GFR, Estimated: >60 mL/min (ref >60)
Glucose, Bld: 126 mg/dL — ABNORMAL HIGH (ref 70–99)
Potassium: 3.1 mmol/L — ABNORMAL LOW (ref 3.5–5.1)
Sodium: 132 mmol/L — ABNORMAL LOW (ref 135–145)
Total Bilirubin: 0.7 mg/dL (ref 0.3–1.2)
Total Protein: 6.5 g/dL (ref 6.5–8.1)

## 2022-02-22 LAB — CBC WITH DIFFERENTIAL/PLATELET
Abs Immature Granulocytes: 0.03 10*3/uL (ref 0.00–0.07)
Basophils Absolute: 0.1 10*3/uL (ref 0.0–0.1)
Basophils Relative: 1 %
Eosinophils Absolute: 0.1 10*3/uL (ref 0.0–0.5)
Eosinophils Relative: 1 %
HCT: 37.8 % (ref 36.0–46.0)
Hemoglobin: 12.8 g/dL (ref 12.0–15.0)
Immature Granulocytes: 0 %
Lymphocytes Relative: 12 %
Lymphs Abs: 0.9 10*3/uL (ref 0.7–4.0)
MCH: 30.3 pg (ref 26.0–34.0)
MCHC: 33.9 g/dL (ref 30.0–36.0)
MCV: 89.6 fL (ref 80.0–100.0)
Monocytes Absolute: 0.5 10*3/uL (ref 0.1–1.0)
Monocytes Relative: 7 %
Neutro Abs: 5.9 10*3/uL (ref 1.7–7.7)
Neutrophils Relative %: 79 %
Platelets: 262 10*3/uL (ref 150–400)
RBC: 4.22 MIL/uL (ref 3.87–5.11)
RDW: 12.7 % (ref 11.5–15.5)
WBC: 7.5 10*3/uL (ref 4.0–10.5)
nRBC: 0 % (ref 0.0–0.2)

## 2022-02-22 LAB — LACTIC ACID, PLASMA: Lactic Acid, Venous: 1.3 mmol/L (ref 0.5–1.9)

## 2022-02-22 MED ORDER — ONDANSETRON HCL 4 MG/2ML IJ SOLN: 4.0000 mg | Freq: Four times a day (QID) | INTRAMUSCULAR | Status: DC | PRN

## 2022-02-22 MED ORDER — ACETAMINOPHEN 325 MG PO TABS: 650.0000 mg | ORAL_TABLET | Freq: Four times a day (QID) | ORAL | Status: DC | PRN

## 2022-02-22 MED ORDER — ONDANSETRON HCL 4 MG PO TABS: 4.0000 mg | ORAL_TABLET | Freq: Four times a day (QID) | ORAL | Status: DC | PRN

## 2022-02-22 MED ORDER — POTASSIUM CHLORIDE IN NACL 20-0.9 MEQ/L-% IV SOLN: INTRAVENOUS | Status: DC

## 2022-02-22 MED ORDER — TRAMADOL HCL 50 MG PO TABS: 50.0000 mg | ORAL_TABLET | Freq: Four times a day (QID) | ORAL | Status: DC | PRN

## 2022-02-22 MED ORDER — MORPHINE SULFATE (PF) 4 MG/ML IV SOLN
4.0000 mg | Freq: Once | INTRAVENOUS | Status: AC
  Administered 2022-02-22: 4 mg via INTRAVENOUS
  Filled 2022-02-22: qty 1

## 2022-02-22 MED ORDER — POTASSIUM CHLORIDE CRYS ER 20 MEQ PO TBCR
20.0000 meq | EXTENDED_RELEASE_TABLET | Freq: Once | ORAL | Status: AC
Start: 2022-02-22 — End: 2022-02-22
  Administered 2022-02-22: 20 meq via ORAL
  Filled 2022-02-22: qty 1

## 2022-02-22 MED ORDER — IOHEXOL 300 MG/ML  SOLN
100.0000 mL | Freq: Once | INTRAMUSCULAR | Status: AC | PRN
  Administered 2022-02-22: 100 mL via INTRAVENOUS

## 2022-02-22 MED ORDER — ACETAMINOPHEN 650 MG RE SUPP: 650.0000 mg | Freq: Four times a day (QID) | RECTAL | Status: DC | PRN

## 2022-02-22 MED ORDER — ENOXAPARIN SODIUM 40 MG/0.4ML IJ SOSY
40.0000 mg | PREFILLED_SYRINGE | INTRAMUSCULAR | Status: DC
  Administered 2022-02-22: 40 mg via SUBCUTANEOUS
  Filled 2022-02-22: qty 0.4

## 2022-02-22 NOTE — Progress Notes (Signed)
Patient arrived to room. Alert and oriented X4. Resting comfortably and call bell within reach.

## 2022-02-22 NOTE — Plan of Care (Signed)

## 2022-02-22 NOTE — Assessment & Plan Note (Signed)
Holding metformin NovoLog sliding scale Check A1c

## 2022-02-22 NOTE — ED Notes (Signed)
Ice pack applied to pts right lower quadrant. Nurse notified.

## 2022-02-22 NOTE — ED Notes (Signed)
Patient transported to CT 

## 2022-02-22 NOTE — Assessment & Plan Note (Signed)
Second volume depletion Start IV fluids A.m. BMP

## 2022-02-22 NOTE — Assessment & Plan Note (Signed)
Holding amlodipine, losartan, HCTZ temporarily Monitor BP off medications

## 2022-02-22 NOTE — Assessment & Plan Note (Signed)
Replete Check magnesium 

## 2022-02-22 NOTE — Consult Note (Signed)
[facility] Consult  Reason for Consult: Incarcerated ventral hernia Referring Physician: Dr. Sabra Heck  Chief Complaint   Abdominal Pain     HPI: Whitney Watts is a 88 y.o. female who presented to the hospital with a 4-day history of constipation and incarcerated hernia.  She states that she has an hernia in the mid to right side of her abdomen, that has been present for very long time.  She normally is able to push the hernia back in after it comes out, but she was unable to push it back in since Saturday.  She states that it was very swollen over the weekend, but it has subsequently decreased in size.  She does confirm passing some flatus this morning, and having a small bowel movement.  She has some tenderness associated with the hernia, but denies any nausea or vomiting.  Her surgical history is significant for an appendectomy and abdominal hysterectomy.  She has a past medical history significant for diabetes and hypertension.  She denies use of blood thinning medications.  In the ED, the ED physician was unable to initially reduce the hernia.  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  Her blood work did not demonstrate a leukocytosis, and her lactic acid was normal.  Past Medical History:  Diagnosis Date   DM (diabetes mellitus) ([facility])    HTN (hypertension)     Past Surgical History:  Procedure Laterality Date   ABDOMINAL HYSTERECTOMY     APPENDECTOMY     LUMBAR DISC SURGERY      Family History  Problem Relation Age of Onset   Diabetes Other    Cancer Other    Arthritis Other     Social History   Tobacco Use   Smoking status: Never   Smokeless tobacco: Never  Vaping Use   Vaping Use: Never used  Substance Use Topics   Alcohol use: No   Drug use: No    Medications: I have reviewed the patient's current  medications.  Allergies  Allergen Reactions   Bee Venom Swelling and Rash   Pravastatin Sodium Other (See Comments)    REACTION: Leg weakness   Rosuvastatin Other (See Comments)    REACTION: Body aches     ROS:  Constitutional: negative for chills, fatigue, and fevers Respiratory: negative for cough and shortness of breath Cardiovascular: negative for chest pain and palpitations Gastrointestinal: positive for hernia, negative for abdominal pain, nausea, and vomiting Musculoskeletal:negative for bone pain and neck pain  Blood pressure 123/69, pulse 80, temperature 98.2 F (36.8 C), temperature source Oral, resp. rate 18, height 5\' 6"  (1.676 m), weight 61.2 kg, SpO2 97 %. Physical Exam Vitals reviewed.  Constitutional:      Appearance: She is well-developed.  HENT:     Head: Normocephalic and atraumatic.  Eyes:     Extraocular Movements: Extraocular movements intact.     Pupils: Pupils are equal, round, and reactive to light.  Cardiovascular:     Rate and Rhythm: Normal rate.  Pulmonary:     Effort: Pulmonary effort is normal.  Abdominal:     Comments: Abdomen soft, nondistended, no percussion tenderness, nontender to palpation; likely incisional hernia at superior aspect of abdominal hysterectomy incision, hernia able to be fully reduced, defect is 3 cm in size  Skin:    General: Skin is warm and dry.  Neurological:  General: No focal deficit present.     Mental Status: She is alert and oriented to person, place, and time.  Psychiatric:        Mood and Affect: Mood normal.        Behavior: Behavior normal.     Results: Results for orders placed or performed during the hospital encounter of 02/22/22 (from the past 48 hour(s))  CBC with Differential     Status: None   Collection Time: 02/22/22  7:53 AM  Result Value Ref Range   WBC 7.5 4.0 - 10.5 K/uL   RBC 4.22 3.87 - 5.11 MIL/uL   Hemoglobin 12.8 12.0 - 15.0 g/dL   HCT 37.8 36.0 - 46.0 %   MCV 89.6 80.0 -  100.0 fL   MCH 30.3 26.0 - 34.0 pg   MCHC 33.9 30.0 - 36.0 g/dL   RDW 12.7 11.5 - 15.5 %   Platelets 262 150 - 400 K/uL   nRBC 0.0 0.0 - 0.2 %   Neutrophils Relative % 79 %   Neutro Abs 5.9 1.7 - 7.7 K/uL   Lymphocytes Relative 12 %   Lymphs Abs 0.9 0.7 - 4.0 K/uL   Monocytes Relative 7 %   Monocytes Absolute 0.5 0.1 - 1.0 K/uL   Eosinophils Relative 1 %   Eosinophils Absolute 0.1 0.0 - 0.5 K/uL   Basophils Relative 1 %   Basophils Absolute 0.1 0.0 - 0.1 K/uL   Immature Granulocytes 0 %   Abs Immature Granulocytes 0.03 0.00 - 0.07 K/uL    Comment: Performed at [facility], [address], [place], [place] [address]  Lactic acid, plasma     Status: None   Collection Time: 02/22/22  7:53 AM  Result Value Ref Range   Lactic Acid, Venous 1.3 0.5 - 1.9 mmol/L    Comment: Performed at [facility], [address], [place], [place] [address]  Comprehensive metabolic panel     Status: Abnormal   Collection Time: 02/22/22  7:53 AM  Result Value Ref Range   Sodium 132 (L) 135 - 145 mmol/L   Potassium 3.1 (L) 3.5 - 5.1 mmol/L   Chloride 100 98 - 111 mmol/L   CO2 22 22 - 32 mmol/L   Glucose, Bld 126 (H) 70 - 99 mg/dL    Comment: Glucose reference range applies only to samples taken after fasting for at least 8 hours.   BUN 16 8 - 23 mg/dL   Creatinine, Ser 0.55 0.44 - 1.00 mg/dL   Calcium 9.0 8.9 - 10.3 mg/dL   Total Protein 6.5 6.5 - 8.1 g/dL   Albumin 3.5 3.5 - 5.0 g/dL   AST 15 15 - 41 U/L   ALT 12 0 - 44 U/L   Alkaline Phosphatase 71 38 - 126 U/L   Total Bilirubin 0.7 0.3 - 1.2 mg/dL   GFR, Estimated >60 >60 mL/min    Comment: (NOTE) Calculated using the CKD-EPI Creatinine Equation (2021)    Anion gap 10 5 - 15    Comment: Performed at [facility], [address], [place],  [address]    CT ABDOMEN PELVIS W CONTRAST  Result Date: 02/22/2022 CLINICAL DATA:  Painful known hernia. Evaluate for small bowel obstruction/incarceration. EXAM: CT ABDOMEN AND PELVIS WITH  CONTRAST TECHNIQUE: Multidetector CT imaging of the abdomen and pelvis was performed using the standard protocol following bolus administration of intravenous contrast. RADIATION DOSE REDUCTION: This exam was performed according to the departmental dose-optimization program which includes automated exposure  control, adjustment of the mA and/or kV according to patient size and/or use of iterative reconstruction technique. CONTRAST:  175mL OMNIPAQUE IOHEXOL 300 MG/ML  SOLN COMPARISON:  None Available. FINDINGS: Lower chest: Atelectasis noted dependent right lower lobe. Hepatobiliary: 17 mm well-defined homogeneous low-density lesion in the dome of the liver is likely a cyst. Ill-defined hypodensities in the left liver measure up to 1.9 cm and cannot be definitively characterized. Multiple gallstones evident measuring up to 2.4 cm diameter. Mild intrahepatic biliary duct prominence. No extrahepatic biliary duct dilatation. Pancreas: No focal mass lesion. No dilatation of the main duct. No intraparenchymal cyst. No peripancreatic edema. Spleen: No splenomegaly. No focal mass lesion. Adrenals/Urinary Tract: Nodular thickening noted both adrenal glands. No overtly suspicious abnormality in either kidney. 1.5 x 1.0 x 1.0 cm subcapsular lesion lower pole right kidney (33/2) has attenuation too high to be a simple cyst. No evidence for hydroureter. The urinary bladder appears normal for the degree of distention. Stomach/Bowel: Stomach is nondistended. Duodenum is normally positioned as is the ligament of Treitz. No substantial small bowel dilatation. The cecum and ascending colon are markedly distended and fluid-filled measuring up to 9 cm diameter in the cecum. Right colon remains dilated through the hepatic flexure into the proximal transverse segment where it extends out through the patient's right spigelian hernia. Exiting colon is completely decompressed in there is some fluid in the sac around the herniated colon.  Transverse colon distal to the hernia contains a small volume of gas and is nondilated. Left: And sigmoid colon are nondistended. Vascular/Lymphatic: There is advanced atherosclerotic calcification of the abdominal aorta without aneurysm. There is no gastrohepatic or hepatoduodenal ligament lymphadenopathy. No retroperitoneal or mesenteric lymphadenopathy. No pelvic sidewall lymphadenopathy. Reproductive: Unremarkable. Other: Small volume free fluid noted in the paracolic gutters bilaterally and central pelvis. Musculoskeletal: No worrisome lytic or sclerotic osseous abnormality. IMPRESSION: 1. Markedly distended and fluid-filled cecum and ascending colon measuring up to 9 cm diameter in the cecum. Right colon remains dilated through the hepatic flexure into the proximal transverse segment where it extends out through the patient's right Spigelian hernia. Exiting colon is completely decompressed and there is some fluid in the sac around the herniated colon. Transverse and left colon distal to the hernia is nondistended. Imaging features are consistent with incarcerated herniation of the proximal transverse colon. 2. Small volume free fluid in the paracolic gutters bilaterally and central pelvis. 3. Cholelithiasis. 4. 1.5 x 1.0 x 1.0 cm subcapsular lesion lower pole right kidney has attenuation too high to be a simple cyst. This may be a cyst complicated by proteinaceous debris or hemorrhage. Neoplasm such as papillary renal cell carcinoma cannot be excluded. 5. Ill-defined hypodensities in the left liver measure up to 1.9 cm and cannot be definitively characterized. While likely benign, follow-up recommended to ensure stability as clinically warranted. 6. Aortic Atherosclerosis (ICD10-I70.0). Electronically Signed   By: Misty Stanley M.D.   On: 02/22/2022 09:29     Assessment & Plan:  Whitney Watts is a 88 y.o. female who presented with an incarcerated incisional hernia containing transverse colon.  No  leukocytosis.  Imaging and blood work evaluated by myself.  -I was able to reduce the patient's hernia at bedside.  She states significant improvement and abdominal complaints -Recommend observation overnight to verify the patient does not develop any ischemia at the proximal colon or at the area that was previously incarcerated -Explained to the patient and her daughter that while I was able to reduce the  hernia, she will need to be monitored to verify that she does not demonstrate signs of ischemia.  I did explain that if her abdominal pain begins to worsen, she may require surgical intervention to evaluate her bowel viability -I explained that technically we would recommend repair of this hernia in an elective situation, but given the patient's age, it may be in her best interest to monitor the hernia and know when to present for issues associated with the hernia.  Patient and her daughter are understanding, and we will discuss this further tomorrow -Admit to hospitalist -Okay for regular diet -No need for antibiotics  All questions were answered to the satisfaction of the patient and family.  -- Graciella Freer, DO [facility] [address] [place], [place] [address] [phone] (office)

## 2022-02-22 NOTE — ED Notes (Signed)
Patient returned from CT

## 2022-02-22 NOTE — Assessment & Plan Note (Signed)
Patient had incarcerated transverse colon Reduced at the bedside by Dr. Okey Dupre -Requesting overnight admission to ensure no complications -Advance diet

## 2022-02-22 NOTE — ED Provider Notes (Signed)
[facility] EMERGENCY DEPARTMENT Provider Note   CSN: [id number] Arrival date & time: 02/22/22  0730     History  Chief Complaint  Patient presents with   Abdominal Pain    Whitney Watts is a 88 y.o. female.   Abdominal Pain   This patient is a 88 year old female, history of hypertension and diabetes on oral medications, she has a known history of a right paraumbilical hernia which she is able to self reduce in the past and rarely causes problems.  This has come out and started to bother her over the last couple of days and is not able to go back inside.  She is not nauseated or vomiting but has had very little stool output and has not had any gas today.  The pain is what brought her to the hospital.  She has not been seen by surgeon recently and was told that this was nonsurgical in the past because it was not causing any problems.  She has no fevers or chills, again no nausea vomiting or diarrhea, no other complaint  Home Medications Prior to Admission medications   Medication Sig Start Date End Date Taking? Authorizing Provider  acetaminophen (TYLENOL) 500 MG tablet Take 500 mg by mouth every 6 (six) hours as needed.    [provider]  amLODipine (NORVASC) 10 MG tablet Take 10 mg by mouth daily.    [provider]  hydrochlorothiazide (HYDRODIURIL) 12.5 MG tablet Take 12.5 mg by mouth every morning. 04/23/20   [provider]  losartan (COZAAR) 100 MG tablet Take 100 mg by mouth every morning. 04/23/20   [provider]  metFORMIN (GLUCOPHAGE-XR) 500 MG 24 hr tablet Take 1,000 mg by mouth 2 (two) times daily. 04/23/20   [provider]  traMADol (ULTRAM) 50 MG tablet TAKE ONE TABLET BY MOUTH EVERY 6 HOURS AS NEEDED 11/10/21   Mordecai Rasmussen, MD      Allergies    Bee venom, Pravastatin sodium, and Rosuvastatin    Review of Systems   Review of Systems  Gastrointestinal:  Positive for abdominal pain.  All other systems reviewed and are  negative.   Physical Exam Updated Vital Signs BP 123/63   Pulse 80   Temp 98.2 F (36.8 C) (Oral)   Resp 16   Ht 1.676 m (5\' 6" )   Wt 61.2 kg   SpO2 95%   BMI 21.79 kg/m  Physical Exam Vitals and nursing note reviewed.  Constitutional:      General: She is not in acute distress.    Appearance: She is well-developed.  HENT:     Head: Normocephalic and atraumatic.     Mouth/Throat:     Pharynx: No oropharyngeal exudate.  Eyes:     General: No scleral icterus.       Right eye: No discharge.        Left eye: No discharge.     Conjunctiva/sclera: Conjunctivae normal.     Pupils: Pupils are equal, round, and reactive to light.  Neck:     Thyroid: No thyromegaly.     Vascular: No JVD.  Cardiovascular:     Rate and Rhythm: Normal rate and regular rhythm.     Heart sounds: Normal heart sounds. No murmur heard.    No friction rub. No gallop.  Pulmonary:     Effort: Pulmonary effort is normal. No respiratory distress.     Breath sounds: Normal breath sounds. No wheezing or rales.  Abdominal:  General: Bowel sounds are normal. There is no distension.     Palpations: Abdomen is soft. There is no mass.     Tenderness: There is abdominal tenderness.     Comments: The abdomen is generally nontender and certainly nonperitoneal however there does appear to be a protruding periumbilical hernia on the right which is not reducible  Musculoskeletal:        General: No tenderness. Normal range of motion.     Cervical back: Normal range of motion and neck supple.     Right lower leg: No edema.     Left lower leg: No edema.  Lymphadenopathy:     Cervical: No cervical adenopathy.  Skin:    General: Skin is warm and dry.     Findings: No erythema or rash.  Neurological:     Mental Status: She is alert.     Coordination: Coordination normal.  Psychiatric:        Behavior: Behavior normal.     ED Results / Procedures / Treatments   Labs (all labs ordered are listed, but only  abnormal results are displayed) Labs Reviewed  COMPREHENSIVE METABOLIC PANEL - Abnormal; Notable for the following components:      Result Value   Sodium 132 (*)    Potassium 3.1 (*)    Glucose, Bld 126 (*)    All other components within normal limits  CBC WITH DIFFERENTIAL/PLATELET  LACTIC ACID, PLASMA    EKG None  Radiology CT ABDOMEN PELVIS W CONTRAST  Result Date: 02/22/2022 CLINICAL DATA:  Painful known hernia. Evaluate for small bowel obstruction/incarceration. EXAM: CT ABDOMEN AND PELVIS WITH CONTRAST TECHNIQUE: Multidetector CT imaging of the abdomen and pelvis was performed using the standard protocol following bolus administration of intravenous contrast. RADIATION DOSE REDUCTION: This exam was performed according to the departmental dose-optimization program which includes automated exposure control, adjustment of the mA and/or kV according to patient size and/or use of iterative reconstruction technique. CONTRAST:  113mL OMNIPAQUE IOHEXOL 300 MG/ML  SOLN COMPARISON:  None Available. FINDINGS: Lower chest: Atelectasis noted dependent right lower lobe. Hepatobiliary: 17 mm well-defined homogeneous low-density lesion in the dome of the liver is likely a cyst. Ill-defined hypodensities in the left liver measure up to 1.9 cm and cannot be definitively characterized. Multiple gallstones evident measuring up to 2.4 cm diameter. Mild intrahepatic biliary duct prominence. No extrahepatic biliary duct dilatation. Pancreas: No focal mass lesion. No dilatation of the main duct. No intraparenchymal cyst. No peripancreatic edema. Spleen: No splenomegaly. No focal mass lesion. Adrenals/Urinary Tract: Nodular thickening noted both adrenal glands. No overtly suspicious abnormality in either kidney. 1.5 x 1.0 x 1.0 cm subcapsular lesion lower pole right kidney (33/2) has attenuation too high to be a simple cyst. No evidence for hydroureter. The urinary bladder appears normal for the degree of distention.  Stomach/Bowel: Stomach is nondistended. Duodenum is normally positioned as is the ligament of Treitz. No substantial small bowel dilatation. The cecum and ascending colon are markedly distended and fluid-filled measuring up to 9 cm diameter in the cecum. Right colon remains dilated through the hepatic flexure into the proximal transverse segment where it extends out through the patient's right spigelian hernia. Exiting colon is completely decompressed in there is some fluid in the sac around the herniated colon. Transverse colon distal to the hernia contains a small volume of gas and is nondilated. Left: And sigmoid colon are nondistended. Vascular/Lymphatic: There is advanced atherosclerotic calcification of the abdominal aorta without aneurysm. There is no  gastrohepatic or hepatoduodenal ligament lymphadenopathy. No retroperitoneal or mesenteric lymphadenopathy. No pelvic sidewall lymphadenopathy. Reproductive: Unremarkable. Other: Small volume free fluid noted in the paracolic gutters bilaterally and central pelvis. Musculoskeletal: No worrisome lytic or sclerotic osseous abnormality. IMPRESSION: 1. Markedly distended and fluid-filled cecum and ascending colon measuring up to 9 cm diameter in the cecum. Right colon remains dilated through the hepatic flexure into the proximal transverse segment where it extends out through the patient's right Spigelian hernia. Exiting colon is completely decompressed and there is some fluid in the sac around the herniated colon. Transverse and left colon distal to the hernia is nondistended. Imaging features are consistent with incarcerated herniation of the proximal transverse colon. 2. Small volume free fluid in the paracolic gutters bilaterally and central pelvis. 3. Cholelithiasis. 4. 1.5 x 1.0 x 1.0 cm subcapsular lesion lower pole right kidney has attenuation too high to be a simple cyst. This may be a cyst complicated by proteinaceous debris or hemorrhage. Neoplasm such as  papillary renal cell carcinoma cannot be excluded. 5. Ill-defined hypodensities in the left liver measure up to 1.9 cm and cannot be definitively characterized. While likely benign, follow-up recommended to ensure stability as clinically warranted. 6. Aortic Atherosclerosis (ICD10-I70.0). Electronically Signed   By: Misty Stanley M.D.   On: 02/22/2022 09:29    Procedures Procedures    Medications Ordered in ED Medications  morphine (PF) 4 MG/ML injection 4 mg (4 mg Intravenous Given 02/22/22 0813)  iohexol (OMNIPAQUE) 300 MG/ML solution 100 mL (100 mLs Intravenous Contrast Given 02/22/22 0900)    ED Course/ Medical Decision Making/ A&P                           Medical Decision Making Amount and/or Complexity of Data Reviewed Labs: ordered. Radiology: ordered.  Risk Prescription drug management. Decision regarding hospitalization.   This patient presents to the ED for concern of abdominal discomfort and hernia, this involves an extensive number of treatment options, and is a complaint that carries with it a high risk of complications and morbidity.  The differential diagnosis includes incarcerated hernia, strangulated hernia, bowel ischemia,    Co morbidities that complicate the patient evaluation  Chronic hernia, elderly, hypertension and diabetes   Additional history obtained:  Additional history obtained from electronic medical record External records from outside source obtained and reviewed including prior radiology studies, the patient has not had any CT scans of her abdomen and pelvis in at least the last 16 years that I can see   Lab Tests:  I Ordered, and personally interpreted labs.  The pertinent results include: CBC and lactic acid unremarkable   Imaging Studies ordered:  I ordered imaging studies including CT scan of the abdomen and pelvis I independently visualized and interpreted imaging which showed incarcerated spigelian hernia of transverse colon I  agree with the radiologist interpretation   Cardiac Monitoring: / EKG:  The patient was maintained on a cardiac monitor.  I personally viewed and interpreted the cardiac monitored which showed an underlying rhythm of: Normal sinus rhythm   Consultations Obtained:  I requested consultation with the general surgery as well as hospitalist,  and discussed lab and imaging findings as well as pertinent plan - they recommend: Bedside evaluation by general surgery who was able to manually reduce the hernia, hospitalist will admit for observation overnight   Problem List / ED Course / Critical interventions / Medication management  As above, consultant required for reduction  as I was unable to reduce this hernia manually prior to CT scan I ordered medication including IV fluids for general dehydration Reevaluation of the patient after these medicines showed that the patient improved I have reviewed the patients home medicines and have made adjustments as needed   Social Determinants of Health:  Elderly   Test / Admission - Considered:  We will admit to the hospital Dr. Carles Collet has been kind enough to admit         Final Clinical Impression(s) / ED Diagnoses Final diagnoses:  Incarcerated hernia  Generalized abdominal pain    Rx / DC Orders ED Discharge Orders     None         Noemi Chapel, MD 02/22/22 1028

## 2022-02-22 NOTE — ED Triage Notes (Signed)
Pt c/o umbilical hernia pain and states she has been constipated x 4 days

## 2022-02-22 NOTE — H&P (Signed)
History and Physical    PatientAEMILIA Watts Watts:[id number] DOB: Apr 22, 1923 DOA: 02/22/2022 DOS: the patient was seen and examined on 02/22/2022 PCP: Celene Squibb, MD  Patient coming from: Home  Chief Complaint:  Chief Complaint  Patient presents with   Abdominal Pain   HPI: Whitney Watts is a Likely25 year old female with a history of diabetes and hypertension presenting with 2-day history of increasing abdominal pain and discomfort about her paraumbilical hernia.  The patient has a known history of a right-sided periumbilical hernia which the patient normally is able to self reduce when there is a herniation.  However, the patient began developing increasing abdominal pain on 02/19/2022.  She was not able to reduce her hernia and continued to have increasing abdominal pain.  She has some nausea without emesis.  Otherwise, the patient has been in her usual state of health.  Daughter at the bedside states that the patient has not had any new complaints and has been eating fine prior to the current event.  Patient denies fevers, chills, headache, chest pain, dyspnea, nausea, vomiting, diarrhea, dysuria, hematuria, hematochezia, and melena. In the ED, the patient was afebrile and hemodynamically stable with oxygen saturation 95% on room air.  BMP showed sodium 132, potassium 3.1, bicarbonate 22, serum creatinine 0.55.  LFTs were unremarkable.  WBC 7.5, hemoglobin 12.8, platelets 262,000.  CT abdomen showed marked distended and fluid-filled cecum and ascending colon up to 9 cm.  The exiting colon was completely decompressed.  There was fluid in the hernia sac around the herniated colon consistent with incarceration of the hernia of the proximal transverse colon.  General surgery was consulted.  Dr. Derryl Harbor was able to reduce the hernia at bedside.  She requested observation admission overnight.  Review of Systems: As mentioned in the history of present illness. All other systems reviewed and are  negative. Past Medical History:  Diagnosis Date   DM (diabetes mellitus) ([place])    HTN (hypertension)    Past Surgical History:  Procedure Laterality Date   ABDOMINAL HYSTERECTOMY     APPENDECTOMY     LUMBAR DISC SURGERY     Social History:  reports that she has never smoked. She has never used smokeless tobacco. She reports that she does not drink alcohol and does not use drugs.  Allergies  Allergen Reactions   Bee Venom Swelling and Rash   Pravastatin Sodium Other (See Comments)    REACTION: Leg weakness   Rosuvastatin Other (See Comments)    REACTION: Body aches    Family History  Problem Relation Age of Onset   Diabetes Other    Cancer Other    Arthritis Other     Prior to Admission medications   Medication Sig Start Date End Date Taking? Authorizing Provider  acetaminophen (TYLENOL) 500 MG tablet Take 500 mg by mouth every 6 (six) hours as needed for mild pain.   Yes [provider]  amLODipine (NORVASC) 10 MG tablet Take 10 mg by mouth daily.   Yes [provider]  hydrochlorothiazide (HYDRODIURIL) 12.5 MG tablet Take 12.5 mg by mouth every morning. 04/23/20  Yes [provider]  losartan (COZAAR) 100 MG tablet Take 100 mg by mouth every morning. 04/23/20  Yes [provider]  metFORMIN (GLUCOPHAGE-XR) 500 MG 24 hr tablet Take 1,000 mg by mouth 2 (two) times daily. 04/23/20  Yes [provider]  traMADol (ULTRAM) 50 MG tablet TAKE ONE TABLET BY MOUTH EVERY 6 HOURS AS NEEDED Patient taking  differently: Take 50 mg by mouth every 6 (six) hours as needed for moderate pain. 11/10/21  Yes Mordecai Rasmussen, MD    Physical Exam: Vitals:   02/22/22 0822 02/22/22 0830 02/22/22 0930 02/22/22 1000  BP:  128/63 123/63 (!) 155/88  Pulse: 84 81 80 84  Resp:  18 16 16   Temp:      TempSrc:      SpO2: 97% 95% 95% 99%  Weight:      Height:       GENERAL:  A&O x 3, NAD, well developed, cooperative, follows commands HEENT: [place]/AT, No thrush,  No icterus, No oral ulcers Neck:  No neck mass, No meningismus, soft, supple CV: RRR, no S3, no S4, no rub, no JVD Lungs:  CTA, no wheeze, no rhonchi, good air movement Abd: soft/NT +BS, nondistended Ext: No edema, no lymphangitis, no cyanosis, no rashes Neuro:  CN II-XII intact, strength 4/5 in RUE, RLE, strength 4/5 LUE, LLE; sensation intact bilateral; no dysmetria; babinski equivocal  Data Reviewed: Data reviewed above in history  Assessment and Plan: * Incarcerated hernia Patient had incarcerated transverse colon Reduced at the bedside by Dr. Okey Dupre -Requesting overnight admission to ensure no complications -Advance diet  Hyponatremia Second volume depletion Start IV fluids A.m. BMP  Hypokalemia Replete Check magnesium  Essential hypertension Holding amlodipine, losartan, HCTZ temporarily Monitor BP off medications  Controlled type 2 diabetes mellitus without complication, without long-term current use of insulin (HCC) Holding metformin NovoLog sliding scale Check A1c      Advance Care Planning: FULL CODE  Consults: general surgery  Family Communication: daughter updated 6/20  Severity of Illness: The appropriate patient status for this patient is OBSERVATION. Observation status is judged to be reasonable and necessary in order to provide the required intensity of service to ensure the patient's safety. The patient's presenting symptoms, physical exam findings, and initial radiographic and laboratory data in the context of their medical condition is felt to place them at decreased risk for further clinical deterioration. Furthermore, it is anticipated that the patient will be medically stable for discharge from the hospital within 2 midnights of admission.   Author: Orson Eva, MD 02/22/2022 10:53 AM  For on call review [email or web address].

## 2022-02-22 NOTE — Hospital Course (Addendum)
[age]ikely26 year old female with a history of diabetes and hypertension presenting with 2-day history of increasing abdominal pain and discomfort about her paraumbilical hernia.  The patient has a known history of a right-sided periumbilical hernia which the patient normally is able to self reduce when there is a herniation.  However, the patient began developing increasing abdominal pain on 02/19/2022.  She was not able to reduce her hernia and continued to have increasing abdominal pain.  She has some nausea without emesis.  Otherwise, the patient has been in her usual state of health.  Daughter at the bedside states that the patient has not had any new complaints and has been eating fine prior to the current event.  Patient denies fevers, chills, headache, chest pain, dyspnea, nausea, vomiting, diarrhea, dysuria, hematuria, hematochezia, and melena. In the ED, the patient was afebrile and hemodynamically stable with oxygen saturation 95% on room air.  BMP showed sodium 132, potassium 3.1, bicarbonate 22, serum creatinine 0.55.  LFTs were unremarkable.  WBC 7.5, hemoglobin 12.8, platelets 262,000.  CT abdomen showed marked distended and fluid-filled cecum and ascending colon up to 9 cm.  The exiting colon was completely decompressed.  There was fluid in the hernia sac around the herniated colon consistent with incarceration of the hernia of the proximal transverse colon.  General surgery was consulted.  Dr. Derryl Harbor was able to reduce the hernia at bedside.  She requested observation admission overnight.

## 2022-02-22 NOTE — ED Notes (Signed)
Upon assessment patient soiled with urine. Patients bed linens changed, new brief placed, pericare provided, and purwick in place.

## 2022-02-23 DIAGNOSIS — K430 Incisional hernia with obstruction, without gangrene: Secondary | ICD-10-CM | POA: Diagnosis not present

## 2022-02-23 DIAGNOSIS — K460 Unspecified abdominal hernia with obstruction, without gangrene: Secondary | ICD-10-CM | POA: Diagnosis not present

## 2022-02-23 LAB — BASIC METABOLIC PANEL
Anion gap: 6 (ref 5–15)
BUN: 9 mg/dL (ref 8–23)
CO2: 26 mmol/L (ref 22–32)
Calcium: 8.6 mg/dL — ABNORMAL LOW (ref 8.9–10.3)
Chloride: 107 mmol/L (ref 98–111)
Creatinine, Ser: 0.41 mg/dL — ABNORMAL LOW (ref 0.44–1.00)
GFR, Estimated: >60 mL/min (ref >60)
Glucose, Bld: 90 mg/dL (ref 70–99)
Potassium: 4 mmol/L (ref 3.5–5.1)
Sodium: 139 mmol/L (ref 135–145)

## 2022-02-23 LAB — GLUCOSE, CAPILLARY: Glucose-Capillary: 84 mg/dL (ref 70–99)

## 2022-02-23 LAB — MAGNESIUM: Magnesium: 1.5 mg/dL — ABNORMAL LOW (ref 1.7–2.4)

## 2022-02-23 LAB — HEMOGLOBIN A1C
Hgb A1c MFr Bld: 5.7 % — ABNORMAL HIGH (ref 4.8–5.6)
Mean Plasma Glucose: 116.89 mg/dL

## 2022-02-23 MED ORDER — DOCUSATE SODIUM 100 MG PO CAPS: 100.0000 mg | ORAL_CAPSULE | Freq: Two times a day (BID) | ORAL | 2 refills | Status: AC

## 2022-02-23 MED ORDER — SENNOSIDES-DOCUSATE SODIUM 8.6-50 MG PO TABS: 1.0000 | ORAL_TABLET | Freq: Two times a day (BID) | ORAL | Status: DC

## 2022-02-23 NOTE — Progress Notes (Signed)
Nsg Discharge Note  Admit Date:  02/22/2022 Discharge date: 02/23/2022   Anicka D Pankowski to be D/C'd Home per MD order.  AVS completed.   Removed IV-CDI. Reviewed d/c paperwork with patient and daughter. Answered all questions. Gave patient an abdominal binder. Wheeled stable patient and belongings to main entrance where she was picked up by her daughter to d/c to home Patient/caregiver able to verbalize understanding.  Discharge Medication: Allergies as of 02/23/2022       Reactions   Bee Venom Swelling, Rash   Pravastatin Sodium Other (See Comments)   REACTION: Leg weakness   Rosuvastatin Other (See Comments)   REACTION: Body aches        Medication List     STOP taking these medications    hydrochlorothiazide 12.5 MG tablet Commonly known as: HYDRODIURIL   losartan 100 MG tablet Commonly known as: COZAAR       TAKE these medications    acetaminophen 500 MG tablet Commonly known as: TYLENOL Take 500 mg by mouth every 6 (six) hours as needed for mild pain.   amLODipine 10 MG tablet Commonly known as: NORVASC Take 10 mg by mouth daily.   docusate sodium 100 MG capsule Commonly known as: Colace Take 1 capsule (100 mg total) by mouth 2 (two) times daily.   metFORMIN 500 MG 24 hr tablet Commonly known as: GLUCOPHAGE-XR Take 1,000 mg by mouth 2 (two) times daily.   traMADol 50 MG tablet Commonly known as: ULTRAM TAKE ONE TABLET BY MOUTH EVERY 6 HOURS AS NEEDED What changed: reasons to take this        Discharge Assessment: Vitals:   02/23/22 0212 02/23/22 0403  BP: 137/69 133/64  Pulse: 80 84  Resp: 18 19  Temp: 98 F (36.7 C) 98 F (36.7 C)  SpO2: 96% 96%   Skin clean, dry and intact without evidence of skin break down, no evidence of skin tears noted. IV catheter discontinued intact. Site without signs and symptoms of complications - no redness or edema noted at insertion site, patient denies c/o pain - only slight tenderness at site.  Dressing with  slight pressure applied.  D/c Instructions-Education: Discharge instructions given to patient/family with verbalized understanding. D/c education completed with patient/family including follow up instructions, medication list, d/c activities limitations if indicated, with other d/c instructions as indicated by MD - patient able to verbalize understanding, all questions fully answered. Patient instructed to return to ED, call 911, or call MD for any changes in condition.  Patient escorted via [place], and D/C home via private auto.  Santa Lighter, RN 02/23/2022 12:32 PM

## 2022-02-23 NOTE — Plan of Care (Signed)
  Problem: Education: Goal: Knowledge of General Education information will improve Description: Including pain rating scale, medication(s)/side effects and non-pharmacologic comfort measures 02/23/2022 1110 by Santa Lighter, RN Outcome: Adequate for Discharge 02/23/2022 0725 by Santa Lighter, RN Outcome: Progressing   Problem: Health Behavior/Discharge Planning: Goal: Ability to manage health-related needs will improve 02/23/2022 1110 by Santa Lighter, RN Outcome: Adequate for Discharge 02/23/2022 0725 by Santa Lighter, RN Outcome: Progressing   Problem: Clinical Measurements: Goal: Ability to maintain clinical measurements within normal limits will improve Outcome: Adequate for Discharge Goal: Will remain free from infection Outcome: Adequate for Discharge Goal: Diagnostic test results will improve Outcome: Adequate for Discharge Goal: Respiratory complications will improve Outcome: Adequate for Discharge Goal: Cardiovascular complication will be avoided Outcome: Adequate for Discharge   Problem: Activity: Goal: Risk for activity intolerance will decrease Outcome: Adequate for Discharge   Problem: Nutrition: Goal: Adequate nutrition will be maintained Outcome: Adequate for Discharge   Problem: Coping: Goal: Level of anxiety will decrease Outcome: Adequate for Discharge   Problem: Elimination: Goal: Will not experience complications related to bowel motility Outcome: Adequate for Discharge Goal: Will not experience complications related to urinary retention Outcome: Adequate for Discharge   Problem: Pain Managment: Goal: General experience of comfort will improve Outcome: Adequate for Discharge   Problem: Safety: Goal: Ability to remain free from injury will improve Outcome: Adequate for Discharge   Problem: Skin Integrity: Goal: Risk for impaired skin integrity will decrease Outcome: Adequate for Discharge

## 2022-02-23 NOTE — Plan of Care (Signed)
  Problem: Education: Goal: Knowledge of General Education information will improve Description Including pain rating scale, medication(s)/side effects and non-pharmacologic comfort measures Outcome: Progressing   Problem: Health Behavior/Discharge Planning: Goal: Ability to manage health-related needs will improve Outcome: Progressing   

## 2022-02-23 NOTE — Evaluation (Signed)
Physical Therapy Evaluation Patient Details Name: Whitney Watts MRN: [id number] DOB: 1922/10/10 Today's Date: 02/23/2022  History of Present Illness  Whitney Watts is a Likely72 year old female with a history of diabetes and hypertension presenting with 2-day history of increasing abdominal pain and discomfort about her paraumbilical hernia.  The patient has a known history of a right-sided periumbilical hernia which the patient normally is able to self reduce when there is a herniation.  However, the patient began developing increasing abdominal pain on 02/19/2022.  She was not able to reduce her hernia and continued to have increasing abdominal pain.  She has some nausea without emesis.  Otherwise, the patient has been in her usual state of health.  Daughter at the bedside states that the patient has not had any new complaints and has been eating fine prior to the current event.  Patient denies fevers, chills, headache, chest pain, dyspnea, nausea, vomiting, diarrhea, dysuria, hematuria, hematochezia, and melena.   Clinical Impression  Patient presents supine in bed and consents to PT evaluation. Patient is modified independent with bed mobility requiring extended time but demonstrates good trunk control and appropriate strength with completing task. Patient is modified independent with transfers and use of RW. Minor balance deficits with extended time needed but appropriate coordination observed. Patient able to ambulate 70 feet with min guard assist and RW. Decreased gait speed and lack of spatial awareness resulting in fall risk. Minor gait and balance deviations but fair coordination during gait. Patient tolerated sitting up in chair after therapy with nursing staff notified of mobility status. PLAN:  Patient to be discharged home today and discharged from acute physical therapy to care of nursing for ambulation as tolerated for length of stay with recommendations stated below      Recommendations for  follow up therapy are one component of a multi-disciplinary discharge planning process, led by the attending physician.  Recommendations may be updated based on patient status, additional functional criteria and insurance authorization.  Follow Up Recommendations Home health PT      Assistance Recommended at Discharge Set up Supervision/Assistance  Patient can return home with the following  A little help with walking and/or transfers;Help with stairs or ramp for entrance;Assist for transportation;A little help with bathing/dressing/bathroom    Equipment Recommendations None recommended by PT  Recommendations for Other Services       Functional Status Assessment Patient has had a recent decline in their functional status and demonstrates the ability to make significant improvements in function in a reasonable and predictable amount of time.     Precautions / Restrictions Precautions Precautions: Fall Restrictions Weight Bearing Restrictions: No      Mobility  Bed Mobility Overal bed mobility: Modified Independent             General bed mobility comments: Modified independent supine to sit with extended time needed. Good trunk control scooting to EOB with appropriate strength    Transfers Overall transfer level: Modified independent Equipment used: Rolling walker (2 wheels) Transfers: Sit to/from Stand, Bed to chair/wheelchair/BSC Sit to Stand: Modified independent (Device/Increase time)   Step pivot transfers: Modified independent (Device/Increase time)       General transfer comment: Modified independent with STS and bed to chair with RW. Minor balance deficits but appropriate strength and coordination    Ambulation/Gait Ambulation/Gait assistance: Min guard Gait Distance (Feet): 70 Feet Assistive device: Rolling walker (2 wheels) Gait Pattern/deviations: Step-to pattern, Decreased step length - left, Decreased step length - right, Decreased  stride length,  Narrow base of support, Trunk flexed Gait velocity: decreased     General Gait Details: Ambulates 70 feet with min guard and RW. Decreasd gait speed and lack of spatial awareness causing potential fall risk. Minor gait/balance deviations but fair coordination  Stairs            Wheelchair Mobility    Modified Rankin (Stroke Patients Only)       Balance Overall balance assessment: Mild deficits observed, not formally tested                                           Pertinent Vitals/Pain Pain Assessment Pain Assessment: No/denies pain    Home Living Family/patient expects to be discharged to:: Private residence Living Arrangements: Alone Available Help at Discharge: Neighbor;Available PRN/intermittently Type of Home: House Home Access: Ramped entrance       Home Layout: One level Home Equipment: Conservation officer, nature (2 wheels);Rollator (4 wheels);Cane - single point;Shower seat - built in;Grab bars - tub/shower;Grab bars - toilet      Prior Function Prior Level of Function : Needs assist       Physical Assist : ADLs (physical)   ADLs (physical): IADLs Mobility Comments: Patient reports ambulating at home independently with rollator but is not a community ambulator. Not currently driving ADLs Comments: Patient reports mostly independent with ADL's with needing some assist at times.     Hand Dominance        Extremity/Trunk Assessment   Upper Extremity Assessment Upper Extremity Assessment: Overall WFL for tasks assessed    Lower Extremity Assessment Lower Extremity Assessment: Overall WFL for tasks assessed    Cervical / Trunk Assessment Cervical / Trunk Assessment: Normal  Communication   Communication: No difficulties  Cognition Arousal/Alertness: Awake/alert Behavior During Therapy: WFL for tasks assessed/performed Overall Cognitive Status: Within Functional Limits for tasks assessed                                           General Comments      Exercises     Assessment/Plan    PT Assessment Patient needs continued PT services  PT Problem List Decreased strength;Decreased activity tolerance;Decreased balance;Decreased mobility;Decreased coordination       PT Treatment Interventions      PT Goals (Current goals can be found in the Care Plan section)  Acute Rehab PT Goals Patient Stated Goal: return home PT Goal Formulation: With patient Time For Goal Achievement: 02/23/22 Potential to Achieve Goals: Good    Frequency       Co-evaluation               AM-PAC PT "6 Clicks" Mobility  Outcome Measure Help needed turning from your back to your side while in a flat bed without using bedrails?: None Help needed moving from lying on your back to sitting on the side of a flat bed without using bedrails?: None Help needed moving to and from a bed to a chair (including a wheelchair)?: A Little Help needed standing up from a chair using your arms (e.g., wheelchair or bedside chair)?: A Little Help needed to walk in hospital room?: A Little Help needed climbing 3-5 steps with a railing? : A Lot 6 Click Score: 19    End of  Session   Activity Tolerance: Patient tolerated treatment well;No increased pain;Patient limited by fatigue Patient left: in chair;with call bell/phone within reach Nurse Communication: Mobility status PT Visit Diagnosis: Unsteadiness on feet (R26.81);Other abnormalities of gait and mobility (R26.89);Muscle weakness (generalized) (M62.81)    Time: 3903-0092 PT Time Calculation (min) (ACUTE ONLY): 22 min   Charges:   PT Evaluation $PT Eval Moderate Complexity: 1 Mod PT Treatments $Therapeutic Activity: 8-22 mins        12:28 PM, 02/23/22 Lestine Box, S/PT

## 2022-02-23 NOTE — TOC Transition Note (Signed)
Transition of Care [facility]) - CM/SW Discharge Note   Patient Details  Name: Whitney Watts MRN: [id number] Date of Birth: [birth year]  Transition of Care [facility]) CM/SW Contact:  Salome Arnt, LCSW Phone Number: 02/23/2022, 11:57 AM   Clinical Narrative:  PT evaluated pt and recommend HHPT. Discussed with pt who is agreeable with no preference on agency. Referred and accepted by Lattie Haw with [facility]. Lattie Haw aware of d/c today. Daughter and RN updated. MD notified for HHPT order.       Final next level of care: Home w Home Health Services Barriers to Discharge: Barriers Resolved   Patient Goals and CMS Choice Patient states their goals for this hospitalization and ongoing recovery are:: return home   Choice offered to / list presented to : Patient  Discharge Placement                  Name of family member notified: daughter Patient and family notified of of transfer: 02/23/22  Discharge Plan and Services                          HH Arranged: PT [facility] Agency: [place] Date [place]: 02/23/22 Time [place]: 1157 Representative spoke with at [place]: [place] Determinants of Health ([place]) Interventions     Readmission Risk Interventions     No data to display

## 2022-02-23 NOTE — Progress Notes (Signed)
[facility] Progress Note     Subjective: Patient seen and examined.  She is resting comfortably in the chair.  She was just walking the halls with PT.  She has no complaints at this time.  She tolerated her diet without nausea and vomiting, and she has no abdominal pain.  She also started moving her bowels overnight.  Objective: Vital signs in last 24 hours: Temp:  [98 F (36.7 C)-98.6 F (37 C)] 98 F (36.7 C) (06/21 0403) Pulse Rate:  [75-86] 84 (06/21 0403) Resp:  [16-20] 19 (06/21 0403) BP: (121-139)/(57-75) 133/64 (06/21 0403) SpO2:  [95 %-98 %] 96 % (06/21 0403) Last BM Date : 02/22/22  Intake/Output from previous day: 06/20 0701 - 06/21 0700 In: 366.8 [P.O.:360; I.V.:6.8] Out: 4 [Urine:1; Stool:3] Intake/Output this shift: Total I/O In: 240 [P.O.:240] Out: -   General appearance: alert, cooperative, and no distress GI: Abdomen soft, nondistended, no percussion tenderness, nontender to palpation; no rigidity, guarding, rebound tenderness; incisional ventral hernia is currently reduced  Lab Results:  Recent Labs    02/22/22 0753  WBC 7.5  HGB 12.8  HCT 37.8  PLT 262   BMET Recent Labs    02/22/22 0753 02/23/22 0448  NA 132* 139  K 3.1* 4.0  CL 100 107  CO2 22 26  GLUCOSE 126* 90  BUN 16 9  CREATININE 0.55 0.41*  CALCIUM 9.0 8.6*   PT/INR No results for input(s): "LABPROT", "INR" in the last 72 hours.  Studies/Results: CT ABDOMEN PELVIS W CONTRAST  Result Date: 02/22/2022 CLINICAL DATA:  Painful known hernia. Evaluate for small bowel obstruction/incarceration. EXAM: CT ABDOMEN AND PELVIS WITH CONTRAST TECHNIQUE: Multidetector CT imaging of the abdomen and pelvis was performed using the standard protocol following bolus administration of intravenous contrast. RADIATION DOSE REDUCTION: This exam was performed according to the departmental dose-optimization program which includes automated exposure control, adjustment of the mA and/or kV  according to patient size and/or use of iterative reconstruction technique. CONTRAST:  137mL OMNIPAQUE IOHEXOL 300 MG/ML  SOLN COMPARISON:  None Available. FINDINGS: Lower chest: Atelectasis noted dependent right lower lobe. Hepatobiliary: 17 mm well-defined homogeneous low-density lesion in the dome of the liver is likely a cyst. Ill-defined hypodensities in the left liver measure up to 1.9 cm and cannot be definitively characterized. Multiple gallstones evident measuring up to 2.4 cm diameter. Mild intrahepatic biliary duct prominence. No extrahepatic biliary duct dilatation. Pancreas: No focal mass lesion. No dilatation of the main duct. No intraparenchymal cyst. No peripancreatic edema. Spleen: No splenomegaly. No focal mass lesion. Adrenals/Urinary Tract: Nodular thickening noted both adrenal glands. No overtly suspicious abnormality in either kidney. 1.5 x 1.0 x 1.0 cm subcapsular lesion lower pole right kidney (33/2) has attenuation too high to be a simple cyst. No evidence for hydroureter. The urinary bladder appears normal for the degree of distention. Stomach/Bowel: Stomach is nondistended. Duodenum is normally positioned as is the ligament of Treitz. No substantial small bowel dilatation. The cecum and ascending colon are markedly distended and fluid-filled measuring up to 9 cm diameter in the cecum. Right colon remains dilated through the hepatic flexure into the proximal transverse segment where it extends out through the patient's right spigelian hernia. Exiting colon is completely decompressed in there is some fluid in the sac around the herniated colon. Transverse colon distal to the hernia contains a small volume of gas and is nondilated. Left: And sigmoid colon are nondistended. Vascular/Lymphatic: There is advanced atherosclerotic calcification of the abdominal aorta without  aneurysm. There is no gastrohepatic or hepatoduodenal ligament lymphadenopathy. No retroperitoneal or mesenteric  lymphadenopathy. No pelvic sidewall lymphadenopathy. Reproductive: Unremarkable. Other: Small volume free fluid noted in the paracolic gutters bilaterally and central pelvis. Musculoskeletal: No worrisome lytic or sclerotic osseous abnormality. IMPRESSION: 1. Markedly distended and fluid-filled cecum and ascending colon measuring up to 9 cm diameter in the cecum. Right colon remains dilated through the hepatic flexure into the proximal transverse segment where it extends out through the patient's right Spigelian hernia. Exiting colon is completely decompressed and there is some fluid in the sac around the herniated colon. Transverse and left colon distal to the hernia is nondistended. Imaging features are consistent with incarcerated herniation of the proximal transverse colon. 2. Small volume free fluid in the paracolic gutters bilaterally and central pelvis. 3. Cholelithiasis. 4. 1.5 x 1.0 x 1.0 cm subcapsular lesion lower pole right kidney has attenuation too high to be a simple cyst. This may be a cyst complicated by proteinaceous debris or hemorrhage. Neoplasm such as papillary renal cell carcinoma cannot be excluded. 5. Ill-defined hypodensities in the left liver measure up to 1.9 cm and cannot be definitively characterized. While likely benign, follow-up recommended to ensure stability as clinically warranted. 6. Aortic Atherosclerosis (ICD10-I70.0). Electronically Signed   By: Misty Stanley M.D.   On: 02/22/2022 09:29    Anti-infectives: Anti-infectives (From admission, onward)    None       Assessment/Plan:  Patient is a 88 year old female who was admitted for observation after reduction of an incarcerated incisional hernia containing transverse colon.  -While admitted, the patient has tolerated a diet and moved her bowels with no significant abdominal pain -I explained to the patient that if her hernia bulges and is unable to be reduced, she has nausea or vomiting, or she has obstipation  without bowel movements or flatus, that she needs to present back to the hospital for evaluation -I discussed with both the patient and her daughter that given her age and the relative asymptomatic nature of her hernia, I would not recommend repair at this time -I have provided my information in her discharge paperwork, in the event that the hernia becomes more painful and she would like it repaired in the future -Abdominal binder ordered -Patient stable for discharge at this time   LOS: 0 days    Jabez Molner A Laira Penninger 02/23/2022

## 2022-02-23 NOTE — Discharge Summary (Signed)
Physician Discharge Summary   Patient: Whitney Watts MRN: [id number] DOB: 07/26/1923  Admit date:     02/22/2022  Discharge date: 02/23/22  Discharge Physician: Deatra James   PCP: Celene Squibb, MD   Recommendations at discharge:   Follow-up with general surgery as scheduled Follow-up with PCP in 1-2 weeks Recommend close monitoring for possible recurrent inguinal ventral hernia Continue stool softeners, avoid excessive restraining with bowel movement  Discharge Diagnoses: Principal Problem:   Incarcerated hernia Active Problems:   Controlled type 2 diabetes mellitus without complication, without long-term current use of insulin ([place])   Essential hypertension   Hypokalemia   Hyponatremia  Resolved Problems:   * No resolved hospital problems. [facility] Course: Likely68 year old female with a history of diabetes and hypertension presenting with 2-day history of increasing abdominal pain and discomfort about her paraumbilical hernia.  The patient has a known history of a right-sided periumbilical hernia which the patient normally is able to self reduce when there is a herniation.  However, the patient began developing increasing abdominal pain on 02/19/2022.  She was not able to reduce her hernia and continued to have increasing abdominal pain.  She has some nausea without emesis.  Otherwise, the patient has been in her usual state of health.  Daughter at the bedside states that the patient has not had any new complaints and has been eating fine prior to the current event.  Patient denies fevers, chills, headache, chest pain, dyspnea, nausea, vomiting, diarrhea, dysuria, hematuria, hematochezia, and melena. In the ED, the patient was afebrile and hemodynamically stable with oxygen saturation 95% on room air.  BMP showed sodium 132, potassium 3.1, bicarbonate 22, serum creatinine 0.55.  LFTs were unremarkable.  WBC 7.5, hemoglobin 12.8, platelets 262,000.  CT abdomen showed marked  distended and fluid-filled cecum and ascending colon up to 9 cm.  The exiting colon was completely decompressed.  There was fluid in the hernia sac around the herniated colon consistent with incarceration of the hernia of the proximal transverse colon.  General surgery was consulted.  Dr. Derryl Harbor was able to reduce the hernia at bedside.  She requested observation admission overnight.  Assessment and Plan: * Incarcerated hernia Patient had incarcerated transverse colon Reduced at the bedside by Dr. Okey Dupre -Requesting overnight admission to ensure no complications No complications overnight, -Advance diet... Tolerated p.o. well  Hyponatremia Second volume depletion Improved with IV fluids  Hypokalemia Was repleted  Essential hypertension Resuming amlodipine, losartan, HCTZ -which were held temporarily   Controlled type 2 diabetes mellitus without complication, without long-term current use of insulin ([place]) Resume home medication of metformin, Diabetic diet c      Consultants: General surgery Procedures performed: Manual reduction - of ventral abdominal hernia  Disposition: Home Diet recommendation:  Discharge Diet Orders (From admission, onward)     Start     Ordered   02/23/22 0000  Diet - low sodium heart healthy        02/23/22 1147           Carb modified diet DISCHARGE MEDICATION: Allergies as of 02/23/2022       Reactions   Bee Venom Swelling, Rash   Pravastatin Sodium Other (See Comments)   REACTION: Leg weakness   Rosuvastatin Other (See Comments)   REACTION: Body aches        Medication List     STOP taking these medications    hydrochlorothiazide 12.5 MG tablet Commonly known as: HYDRODIURIL   losartan 100  MG tablet Commonly known as: COZAAR       TAKE these medications    acetaminophen 500 MG tablet Commonly known as: TYLENOL Take 500 mg by mouth every 6 (six) hours as needed for mild pain.   amLODipine 10 MG  tablet Commonly known as: NORVASC Take 10 mg by mouth daily.   docusate sodium 100 MG capsule Commonly known as: Colace Take 1 capsule (100 mg total) by mouth 2 (two) times daily.   metFORMIN 500 MG 24 hr tablet Commonly known as: GLUCOPHAGE-XR Take 1,000 mg by mouth 2 (two) times daily.   traMADol 50 MG tablet Commonly known as: ULTRAM TAKE ONE TABLET BY MOUTH EVERY 6 HOURS AS NEEDED What changed: reasons to take this        Follow-up Information     Pappayliou, Catherine A, DO Follow up.   Specialty: General Surgery Why: As needed Contact information: [address] [place] Hoff [facility] [address] [phone]                Discharge Exam: Filed Weights   02/22/22 0742  Weight: 61.2 kg      Physical Exam:   General:  AAO x 3,  cooperative, no distress;   HEENT:  Normocephalic, PERRL, otherwise with in Normal limits   Neuro:  CNII-XII intact. , normal motor and sensation, reflexes intact   Lungs:   Clear to auscultation BL, Respirations unlabored,  No wheezes / crackles  Cardio:    S1/S2, RRR, No murmure, No Rubs or Gallops   Abdomen:  Soft area noted around periumbilical area,  Otherwise diffusely soft, non-tender, bowel sounds active all four quadrants, no guarding or peritoneal signs.  Muscular  skeletal:  Limited exam -global generalized weaknesses - in bed, able to move all 4 extremities,   2+ pulses,  symmetric, No pitting edema  Skin:  Dry, warm to touch, negative for any Rashes,  Wounds: Please see nursing documentation          Condition at discharge: good  The results of significant diagnostics from this hospitalization (including imaging, microbiology, ancillary and laboratory) are listed below for reference.   Imaging Studies: CT ABDOMEN PELVIS W CONTRAST  Result Date: 02/22/2022 CLINICAL DATA:  Painful known hernia. Evaluate for small bowel obstruction/incarceration. EXAM: CT ABDOMEN AND PELVIS WITH CONTRAST TECHNIQUE:  Multidetector CT imaging of the abdomen and pelvis was performed using the standard protocol following bolus administration of intravenous contrast. RADIATION DOSE REDUCTION: This exam was performed according to the departmental dose-optimization program which includes automated exposure control, adjustment of the mA and/or kV according to patient size and/or use of iterative reconstruction technique. CONTRAST:  133mL OMNIPAQUE IOHEXOL 300 MG/ML  SOLN COMPARISON:  None Available. FINDINGS: Lower chest: Atelectasis noted dependent right lower lobe. Hepatobiliary: 17 mm well-defined homogeneous low-density lesion in the dome of the liver is likely a cyst. Ill-defined hypodensities in the left liver measure up to 1.9 cm and cannot be definitively characterized. Multiple gallstones evident measuring up to 2.4 cm diameter. Mild intrahepatic biliary duct prominence. No extrahepatic biliary duct dilatation. Pancreas: No focal mass lesion. No dilatation of the main duct. No intraparenchymal cyst. No peripancreatic edema. Spleen: No splenomegaly. No focal mass lesion. Adrenals/Urinary Tract: Nodular thickening noted both adrenal glands. No overtly suspicious abnormality in either kidney. 1.5 x 1.0 x 1.0 cm subcapsular lesion lower pole right kidney (33/2) has attenuation too high to be a simple cyst. No evidence for hydroureter. The urinary bladder appears normal for the degree of  distention. Stomach/Bowel: Stomach is nondistended. Duodenum is normally positioned as is the ligament of Treitz. No substantial small bowel dilatation. The cecum and ascending colon are markedly distended and fluid-filled measuring up to 9 cm diameter in the cecum. Right colon remains dilated through the hepatic flexure into the proximal transverse segment where it extends out through the patient's right spigelian hernia. Exiting colon is completely decompressed in there is some fluid in the sac around the herniated colon. Transverse colon distal  to the hernia contains a small volume of gas and is nondilated. Left: And sigmoid colon are nondistended. Vascular/Lymphatic: There is advanced atherosclerotic calcification of the abdominal aorta without aneurysm. There is no gastrohepatic or hepatoduodenal ligament lymphadenopathy. No retroperitoneal or mesenteric lymphadenopathy. No pelvic sidewall lymphadenopathy. Reproductive: Unremarkable. Other: Small volume free fluid noted in the paracolic gutters bilaterally and central pelvis. Musculoskeletal: No worrisome lytic or sclerotic osseous abnormality. IMPRESSION: 1. Markedly distended and fluid-filled cecum and ascending colon measuring up to 9 cm diameter in the cecum. Right colon remains dilated through the hepatic flexure into the proximal transverse segment where it extends out through the patient's right Spigelian hernia. Exiting colon is completely decompressed and there is some fluid in the sac around the herniated colon. Transverse and left colon distal to the hernia is nondistended. Imaging features are consistent with incarcerated herniation of the proximal transverse colon. 2. Small volume free fluid in the paracolic gutters bilaterally and central pelvis. 3. Cholelithiasis. 4. 1.5 x 1.0 x 1.0 cm subcapsular lesion lower pole right kidney has attenuation too high to be a simple cyst. This may be a cyst complicated by proteinaceous debris or hemorrhage. Neoplasm such as papillary renal cell carcinoma cannot be excluded. 5. Ill-defined hypodensities in the left liver measure up to 1.9 cm and cannot be definitively characterized. While likely benign, follow-up recommended to ensure stability as clinically warranted. 6. Aortic Atherosclerosis (ICD10-I70.0). Electronically Signed   By: Misty Stanley M.D.   On: 02/22/2022 09:29   DG FLUORO GUIDED NEEDLE PLC ASPIRATION/INJECTION LOC  Result Date: 02/04/2022 CLINICAL DATA:  RIGHT glenohumeral arthritis, chronic RIGHT shoulder pain EXAM: THERAPEUTIC RIGHT  SHOULDER INJECTION UNDER FLUOROSCOPY TECHNIQUE: Procedure, risks, benefits and alternatives explained to the patient. Patient's questions answered. Written informed consent obtained. Timeout protocol followed. RIGHT joint localized by fluoroscopy. Skin prepped and draped in usual sterile fashion. Skin and soft tissues anesthetized with 3 mL of 1% lidocaine. Under fluoroscopic guidance, 22 gauge spinal needle was advanced into RIGHT shoulder joint. 2 mL of Omnipaque-180 was injected into RIGHT joint without difficulty, confirming intra-articular position of needle tip. Patient was then injected with 3 mL of Sensorcaine 0.5% and 40 mg of Depo-Medrol into the RIGHT shoulder joint. Procedure tolerated well by patient without immediate complication. FLUOROSCOPY: Radiation Exposure Index (as provided by the fluoroscopic device): 0.9 mGy Kerma FINDINGS: As above IMPRESSION: Technically successful therapeutic RIGHT shoulder joint injection. Electronically Signed   By: Lavonia Dana M.D.   On: 02/04/2022 12:10    Microbiology: Results for orders placed or performed during the hospital encounter of 06/17/20  Respiratory Panel by RT PCR (Flu A&B, Covid) - Nasopharyngeal Swab     Status: None   Collection Time: 06/17/20  7:58 PM   Specimen: Nasopharyngeal Swab  Result Value Ref Range Status   SARS Coronavirus 2 by RT PCR NEGATIVE NEGATIVE Final    Comment: (NOTE) SARS-CoV-2 target nucleic acids are NOT DETECTED.  The SARS-CoV-2 RNA is generally detectable in upper respiratoy specimens during the acute  phase of infection. The lowest concentration of SARS-CoV-2 viral copies this assay can detect is 131 copies/mL. A negative result does not preclude SARS-Cov-2 infection and should not be used as the sole basis for treatment or other patient management decisions. A negative result may occur with  improper specimen collection/handling, submission of specimen other than nasopharyngeal swab, presence of viral  mutation(s) within the areas targeted by this assay, and inadequate number of viral copies (<131 copies/mL). A negative result must be combined with clinical observations, patient history, and epidemiological information. The expected result is Negative.  Fact Sheet for Patients:  PinkCheek.be  Fact Sheet for Healthcare Providers:  GravelBags.it  This test is no t yet approved or cleared by the [place] FDA and  has been authorized for detection and/or diagnosis of SARS-CoV-2 by FDA under an Emergency Use Authorization (EUA). This EUA will remain  in effect (meaning this test can be used) for the duration of the COVID-19 declaration under Section 564(b)(1) of the Act, 21 U.S.C. section 360bbb-3(b)(1), unless the authorization is terminated or revoked sooner.     Influenza A by PCR NEGATIVE NEGATIVE Final   Influenza B by PCR NEGATIVE NEGATIVE Final    Comment: (NOTE) The Xpert Xpress SARS-CoV-2/FLU/RSV assay is intended as an aid in  the diagnosis of influenza from Nasopharyngeal swab specimens and  should not be used as a sole basis for treatment. Nasal washings and  aspirates are unacceptable for Xpert Xpress SARS-CoV-2/FLU/RSV  testing.  Fact Sheet for Patients: PinkCheek.be  Fact Sheet for Healthcare Providers: GravelBags.it  This test is not yet approved or cleared by the [place] FDA and  has been authorized for detection and/or diagnosis of SARS-CoV-2 by  FDA under an Emergency Use Authorization (EUA). This EUA will remain  in effect (meaning this test can be used) for the duration of the  Covid-19 declaration under Section 564(b)(1) of the Act, 21  U.S.C. section 360bbb-3(b)(1), unless the authorization is  terminated or revoked. Performed at [facility], [address], [place], [place] [address]   Gram stain     Status: None   Collection  Time: 06/18/20 12:34 PM   Specimen: Pleura; Body Fluid  Result Value Ref Range Status   Specimen Description PLEURAL  Final   Special Requests BOTTLES DRAWN AEROBIC AND ANAEROBIC 10CC  Final   Gram Stain   Final    NO ORGANISMS SEEN WBC PRESENT,BOTH PMN AND MONONUCLEAR Performed at [facility], [address] [place], [place] [address]    Report Status 06/18/2020 FINAL  Final  Culture, body fluid-bottle     Status: None   Collection Time: 06/18/20 12:34 PM   Specimen: Pleura  Result Value Ref Range Status   Specimen Description PLEURAL  Final   Special Requests 10CC  Final   Culture   Final    NO GROWTH 5 DAYS Performed at [facility], [address], [place], [place] [address]    Report Status 06/23/2020 FINAL  Final  SARS Coronavirus 2 by RT PCR (hospital order, performed in [facility] hospital lab) Nasopharyngeal Nasopharyngeal Swab     Status: None   Collection Time: 06/22/20 10:57 AM   Specimen: Nasopharyngeal Swab  Result Value Ref Range Status   SARS Coronavirus 2 NEGATIVE NEGATIVE Final    Comment: (NOTE) SARS-CoV-2 target nucleic acids are NOT DETECTED.  The SARS-CoV-2 RNA is generally detectable in upper and lower respiratory specimens during the acute phase of infection. The lowest concentration of SARS-CoV-2 viral  copies this assay can detect is 250 copies / mL. A negative result does not preclude SARS-CoV-2 infection and should not be used as the sole basis for treatment or other patient management decisions.  A negative result may occur with improper specimen collection / handling, submission of specimen other than nasopharyngeal swab, presence of viral mutation(s) within the areas targeted by this assay, and inadequate number of viral copies (<250 copies / mL). A negative result must be combined with clinical observations, patient history, and epidemiological information.  Fact Sheet for Patients:   StrictlyIdeas.no  Fact Sheet  for Healthcare Providers: BankingDealers.co.za  This test is not yet approved or  cleared by the [place] FDA and has been authorized for detection and/or diagnosis of SARS-CoV-2 by FDA under an Emergency Use Authorization (EUA).  This EUA will remain in effect (meaning this test can be used) for the duration of the COVID-19 declaration under Section 564(b)(1) of the Act, 21 U.S.C. section 360bbb-3(b)(1), unless the authorization is terminated or revoked sooner.  Performed at [facility], [address] [place], [place] [address]     Labs: CBC: Recent Labs  Lab 02/22/22 0753  WBC 7.5  NEUTROABS 5.9  HGB 12.8  HCT 37.8  MCV 89.6  PLT 881   Basic Metabolic Panel: Recent Labs  Lab 02/22/22 0753 02/23/22 0448  NA 132* 139  K 3.1* 4.0  CL 100 107  CO2 22 26  GLUCOSE 126* 90  BUN 16 9  CREATININE 0.55 0.41*  CALCIUM 9.0 8.6*  MG  --  1.5*   Liver Function Tests: Recent Labs  Lab 02/22/22 0753  AST 15  ALT 12  ALKPHOS 71  BILITOT 0.7  PROT 6.5  ALBUMIN 3.5   CBG: Recent Labs  Lab 02/23/22 0738  GLUCAP 84    Discharge time spent: greater than 30 minutes.  Signed: Deatra James, MD [facility] 02/23/2022

## 2022-02-23 NOTE — Care Management Obs Status (Signed)
[place] NOTIFICATION   Patient Details  Name: Whitney Watts MRN: [id number] Date of Birth: 09-20-1922   Medicare Observation Status Notification Given:  Yes    Tommy Medal 02/23/2022, 12:04 PM

## 2022-02-25 DIAGNOSIS — I10 Essential (primary) hypertension: Secondary | ICD-10-CM | POA: Diagnosis not present

## 2022-02-25 DIAGNOSIS — K429 Umbilical hernia without obstruction or gangrene: Secondary | ICD-10-CM | POA: Diagnosis not present

## 2022-02-25 DIAGNOSIS — E119 Type 2 diabetes mellitus without complications: Secondary | ICD-10-CM | POA: Diagnosis not present

## 2022-02-25 DIAGNOSIS — Z7984 Long term (current) use of oral hypoglycemic drugs: Secondary | ICD-10-CM | POA: Diagnosis not present

## 2022-03-07 DIAGNOSIS — I10 Essential (primary) hypertension: Secondary | ICD-10-CM | POA: Diagnosis not present

## 2022-03-07 DIAGNOSIS — Z7984 Long term (current) use of oral hypoglycemic drugs: Secondary | ICD-10-CM | POA: Diagnosis not present

## 2022-03-07 DIAGNOSIS — E119 Type 2 diabetes mellitus without complications: Secondary | ICD-10-CM | POA: Diagnosis not present

## 2022-03-07 DIAGNOSIS — K429 Umbilical hernia without obstruction or gangrene: Secondary | ICD-10-CM | POA: Diagnosis not present

## 2022-03-16 DIAGNOSIS — E871 Hypo-osmolality and hyponatremia: Secondary | ICD-10-CM | POA: Diagnosis not present

## 2022-03-16 DIAGNOSIS — Z6830 Body mass index (BMI) 30.0-30.9, adult: Secondary | ICD-10-CM | POA: Diagnosis not present

## 2022-03-16 DIAGNOSIS — I10 Essential (primary) hypertension: Secondary | ICD-10-CM | POA: Diagnosis not present

## 2022-03-16 DIAGNOSIS — E669 Obesity, unspecified: Secondary | ICD-10-CM | POA: Diagnosis not present

## 2022-03-16 DIAGNOSIS — K460 Unspecified abdominal hernia with obstruction, without gangrene: Secondary | ICD-10-CM | POA: Diagnosis not present

## 2022-03-16 DIAGNOSIS — E876 Hypokalemia: Secondary | ICD-10-CM | POA: Diagnosis not present

## 2022-05-03 ENCOUNTER — Encounter: Payer: Self-pay | Admitting: Orthopedic Surgery

## 2022-05-03 ENCOUNTER — Ambulatory Visit (INDEPENDENT_AMBULATORY_CARE_PROVIDER_SITE_OTHER): Payer: PPO | Admitting: Orthopedic Surgery

## 2022-05-03 DIAGNOSIS — M1711 Unilateral primary osteoarthritis, right knee: Secondary | ICD-10-CM

## 2022-05-03 DIAGNOSIS — M19011 Primary osteoarthritis, right shoulder: Secondary | ICD-10-CM

## 2022-05-03 NOTE — Patient Instructions (Signed)

## 2022-05-03 NOTE — Progress Notes (Signed)
Needs toOrthopaedic Clinic Return  Assessment: Whitney Watts is a 88 y.o. female with the following: Right knee arthritis  Plan: Whitney Watts continues to have right knee pain.  Injections have helped for the last 2.5 months.  She would like to proceed with another injection of her right knee.  She is also interested in an image guided steroid injection of her right glenohumeral joint.  We placed an order for this today.   Procedure note injection Right knee joint   Verbal consent was obtained to inject the right knee joint  Timeout was completed to confirm the site of injection.  The skin was prepped with alcohol and ethyl chloride was sprayed at the injection site.  A 21-gauge needle was used to inject 40 mg of Depo-Medrol and 1% lidocaine (3 cc) into the right knee using an anterolateral approach.  There were no complications. A sterile bandage was applied.   Follow-up: Return if symptoms worsen or fail to improve.   Subjective:  Chief Complaint  Patient presents with   Knee Pain    R/ wants injection    History of Present Illness: Whitney Watts is a 88 y.o. female who returns to clinic for repeat evaluation of her right knee.  She last had an injection of her right knee, approximately 3 months ago.  She had excellent relief until a couple of weeks ago.  No specific injury.  She also continues to have pain in her right shoulder.  She would like to proceed with another injection in both her knee and her shoulder.   Review of Systems: No fevers or chills No numbness or tingling No chest pain No shortness of breath No bowel or bladder dysfunction No GI distress No headaches   Objective: There were no vitals taken for this visit.  Physical Exam:  Elderly female.  No acute distress.  Alert and oriented.  Seated in a wheelchair  Evaluation the right knee demonstrates a valgus alignment.  Good functional range of motion.  Tenderness palpation along the lateral joint  line.    IMAGING: I personally ordered and reviewed the following images:  No imaging obtained today.  Mordecai Rasmussen, MD 05/03/2022 10:25 PM

## 2022-05-05 DIAGNOSIS — E782 Mixed hyperlipidemia: Secondary | ICD-10-CM | POA: Diagnosis not present

## 2022-05-05 DIAGNOSIS — E1169 Type 2 diabetes mellitus with other specified complication: Secondary | ICD-10-CM | POA: Diagnosis not present

## 2022-05-05 DIAGNOSIS — I10 Essential (primary) hypertension: Secondary | ICD-10-CM | POA: Diagnosis not present

## 2022-05-27 ENCOUNTER — Encounter (HOSPITAL_COMMUNITY): Payer: Self-pay

## 2022-05-27 ENCOUNTER — Ambulatory Visit (HOSPITAL_COMMUNITY)
Admission: RE | Admit: 2022-05-27 | Discharge: 2022-05-27 | Disposition: A | Discharge status: Home / Self Care | Payer: PPO | Source: Ambulatory Visit | Attending: Orthopedic Surgery | Admitting: Orthopedic Surgery

## 2022-05-27 DIAGNOSIS — M25511 Pain in right shoulder: Secondary | ICD-10-CM | POA: Diagnosis not present

## 2022-05-27 DIAGNOSIS — M19011 Primary osteoarthritis, right shoulder: Secondary | ICD-10-CM | POA: Insufficient documentation

## 2022-05-27 DIAGNOSIS — G8929 Other chronic pain: Secondary | ICD-10-CM | POA: Diagnosis not present

## 2022-05-27 MED ORDER — IOHEXOL 180 MG/ML  SOLN
INTRAMUSCULAR | Status: AC
  Administered 2022-05-27: 10 mL
  Filled 2022-05-27: qty 10

## 2022-05-27 MED ORDER — METHYLPREDNISOLONE ACETATE 40 MG/ML IJ SUSP
INTRAMUSCULAR | Status: AC
  Administered 2022-05-27: 40 mg
  Filled 2022-05-27: qty 1

## 2022-05-27 MED ORDER — SODIUM CHLORIDE (PF) 0.9 % IJ SOLN
INTRAMUSCULAR | Status: AC
  Filled 2022-05-27: qty 10

## 2022-05-27 MED ORDER — POVIDONE-IODINE 10 % EX SOLN
CUTANEOUS | Status: DC | PRN
Start: 2022-05-27 — End: 2022-05-28
  Administered 2022-05-27: 1 via TOPICAL

## 2022-05-27 MED ORDER — LIDOCAINE HCL (PF) 1 % IJ SOLN
INTRAMUSCULAR | Status: AC
  Administered 2022-05-27: 5 mL
  Filled 2022-05-27: qty 5

## 2022-05-27 MED ORDER — BUPIVACAINE HCL (PF) 0.5 % IJ SOLN
INTRAMUSCULAR | Status: AC
  Administered 2022-05-27: 5 mL
  Filled 2022-05-27: qty 30

## 2022-07-04 DIAGNOSIS — E663 Overweight: Secondary | ICD-10-CM | POA: Diagnosis not present

## 2022-07-04 DIAGNOSIS — Z9849 Cataract extraction status, unspecified eye: Secondary | ICD-10-CM | POA: Diagnosis not present

## 2022-07-04 DIAGNOSIS — K5900 Constipation, unspecified: Secondary | ICD-10-CM | POA: Diagnosis not present

## 2022-07-04 DIAGNOSIS — G8929 Other chronic pain: Secondary | ICD-10-CM | POA: Diagnosis not present

## 2022-07-04 DIAGNOSIS — E1165 Type 2 diabetes mellitus with hyperglycemia: Secondary | ICD-10-CM | POA: Diagnosis not present

## 2022-07-04 DIAGNOSIS — I700 Atherosclerosis of aorta: Secondary | ICD-10-CM | POA: Diagnosis not present

## 2022-07-04 DIAGNOSIS — H9193 Unspecified hearing loss, bilateral: Secondary | ICD-10-CM | POA: Diagnosis not present

## 2022-07-04 DIAGNOSIS — I10 Essential (primary) hypertension: Secondary | ICD-10-CM | POA: Diagnosis not present

## 2022-07-04 DIAGNOSIS — N184 Chronic kidney disease, stage 4 (severe): Secondary | ICD-10-CM | POA: Diagnosis not present

## 2022-07-04 DIAGNOSIS — E1122 Type 2 diabetes mellitus with diabetic chronic kidney disease: Secondary | ICD-10-CM | POA: Diagnosis not present

## 2022-07-04 DIAGNOSIS — E1151 Type 2 diabetes mellitus with diabetic peripheral angiopathy without gangrene: Secondary | ICD-10-CM | POA: Diagnosis not present

## 2022-07-04 DIAGNOSIS — R32 Unspecified urinary incontinence: Secondary | ICD-10-CM | POA: Diagnosis not present

## 2022-07-11 DIAGNOSIS — L210 Seborrhea capitis: Secondary | ICD-10-CM | POA: Diagnosis not present

## 2022-07-11 DIAGNOSIS — M179 Osteoarthritis of knee, unspecified: Secondary | ICD-10-CM | POA: Diagnosis not present

## 2022-07-11 DIAGNOSIS — K460 Unspecified abdominal hernia with obstruction, without gangrene: Secondary | ICD-10-CM | POA: Diagnosis not present

## 2022-07-11 DIAGNOSIS — L989 Disorder of the skin and subcutaneous tissue, unspecified: Secondary | ICD-10-CM | POA: Diagnosis not present

## 2022-07-11 DIAGNOSIS — Z0000 Encounter for general adult medical examination without abnormal findings: Secondary | ICD-10-CM | POA: Diagnosis not present

## 2022-07-11 DIAGNOSIS — E1169 Type 2 diabetes mellitus with other specified complication: Secondary | ICD-10-CM | POA: Diagnosis not present

## 2022-07-11 DIAGNOSIS — I10 Essential (primary) hypertension: Secondary | ICD-10-CM | POA: Diagnosis not present

## 2022-07-11 DIAGNOSIS — I700 Atherosclerosis of aorta: Secondary | ICD-10-CM | POA: Diagnosis not present

## 2022-07-11 DIAGNOSIS — M545 Low back pain, unspecified: Secondary | ICD-10-CM | POA: Diagnosis not present

## 2022-07-11 DIAGNOSIS — R269 Unspecified abnormalities of gait and mobility: Secondary | ICD-10-CM | POA: Diagnosis not present

## 2022-07-11 DIAGNOSIS — Z741 Need for assistance with personal care: Secondary | ICD-10-CM | POA: Diagnosis not present

## 2022-07-11 DIAGNOSIS — E782 Mixed hyperlipidemia: Secondary | ICD-10-CM | POA: Diagnosis not present

## 2022-08-12 ENCOUNTER — Ambulatory Visit: Payer: PPO | Admitting: Orthopedic Surgery

## 2022-09-20 ENCOUNTER — Encounter: Payer: Self-pay | Admitting: Orthopedic Surgery

## 2022-09-20 ENCOUNTER — Ambulatory Visit (INDEPENDENT_AMBULATORY_CARE_PROVIDER_SITE_OTHER): Payer: PPO | Admitting: Orthopedic Surgery

## 2022-09-20 VITALS — Ht 66.0 in | Wt 135.0 lb

## 2022-09-20 DIAGNOSIS — M1711 Unilateral primary osteoarthritis, right knee: Secondary | ICD-10-CM

## 2022-09-20 NOTE — Patient Instructions (Signed)
Instructions Following Joint Injections  In clinic today, you received an injection in one of your joints (sometimes more than one).  Occasionally, you can have some pain at the injection site, this is normal.  You can place ice at the injection site, or take over-the-counter medications such as Tylenol (acetaminophen) or Advil (ibuprofen).  Please follow all directions listed on the bottle.  If your joint (knee or shoulder) becomes swollen, red or very painful, please contact the clinic for additional assistance.   Two medications were injected, including lidocaine and a steroid (often referred to as cortisone).  Lidocaine is effective almost immediately but wears off quickly.  However, the steroid can take a few days to improve your symptoms.  In some cases, it can make your pain worse for a couple of days.  Do not be concerned if this happens as it is common.  You can apply ice or take some over-the-counter medications as needed.     Can return for an ultrasound guided injection in the shoulder.

## 2022-09-20 NOTE — Progress Notes (Signed)
Needs toOrthopaedic Clinic Return  Assessment: Whitney Watts is a 89 y.o. female with the following: Right knee arthritis  Plan: Mrs. Marsalis has severe right knee arthritis.  Last steroid injection provided relief for over 3 months.  She would like to proceed with another injection.  This was completed today in clinic without issues.  When she is able, she can also return to clinic for an ultrasound-guided right glenohumeral joint injection.  Procedure note injection Right knee joint   Verbal consent was obtained to inject the right knee joint  Timeout was completed to confirm the site of injection.  The skin was prepped with alcohol and ethyl chloride was sprayed at the injection site.  A 21-gauge needle was used to inject 40 mg of Depo-Medrol and 1% lidocaine (3 cc) into the right knee using an anterolateral approach.  There were no complications. A sterile bandage was applied.   Follow-up: No follow-ups on file.   Subjective:  Chief Complaint  Patient presents with   Knee Pain    Rt knee injection requested   [place] [id number] Lot: [id number] Exp: 12/04/23     History of Present Illness: Whitney Watts is a 89 y.o. female who returns to clinic for repeat evaluation of her right knee.  Last injection was almost 8 months ago.  She has had good relief with steroid injections, and she would like another injection.  She is also recently had a right shoulder glenohumeral joint injection, which has provided excellent no changes in her health.  She continues to ambulate carefully using a walker.  She is wearing a brace on her right knee.  She notes some swelling in the left ankle area.  Review of Systems: No fevers or chills No numbness or tingling No chest pain No shortness of breath No bowel or bladder dysfunction No GI distress No headaches   Objective: Ht 5\' 6"  (1.676 m)   Wt 135 lb (61.2 kg)   BMI 21.79 kg/m   Physical Exam:  Elderly female.  No acute distress.   Alert and oriented.  Seated in a wheelchair  Evaluation the right knee demonstrates a valgus alignment.  Good functional range of motion.  Tenderness palpation along the lateral joint line.     Left ankle with diffuse swelling.  There is a circumferential ring consistent with the top of her socks.  No bruising.  No skin breakdown.  IMAGING: I personally ordered and reviewed the following images:  No imaging obtained today.  Mordecai Rasmussen, MD 09/20/2022 12:59 PM

## 2022-10-11 ENCOUNTER — Ambulatory Visit (INDEPENDENT_AMBULATORY_CARE_PROVIDER_SITE_OTHER): Payer: PPO | Admitting: Orthopedic Surgery

## 2022-10-11 ENCOUNTER — Encounter: Payer: Self-pay | Admitting: Orthopedic Surgery

## 2022-10-11 DIAGNOSIS — M19011 Primary osteoarthritis, right shoulder: Secondary | ICD-10-CM

## 2022-10-11 NOTE — Progress Notes (Signed)
Orthopaedic Clinic Return  Assessment: EVOLETTE PENDELL is a 89 y.o. female with the following: Right shoulder pain, pseudoparalysis  Plan: Mrs. Suess has pain in her right shoulder.  Pseudoparalysis on physical exam.  She has previously had injections at the hospital, under fluoroscopic guidance.  She would like to proceed with another injection today, using ultrasound guidance.  This was completed without issues.  She will follow-up as needed.  Procedure note injection - Right shoulder, ultrasound guidance   Verbal consent was obtained to inject the Right shoulder, glenohumeral joint  Timeout was completed to confirm the site of injection.   Using the ultrasound, the rotator cuff tendons were identified.  The joint space was also identified. The skin was prepped with alcohol and ethyl chloride was sprayed at the injection site.  A 21-gauge needle was used to inject 40 mg of Depo-Medrol and 1% lidocaine (4 cc) into the glenohumeral joint space of the Right shoulder using a posterolateral approach.  The needle was visualized entering the glenohumeral joint, and the medication was also visualized. There were no complications.  A sterile bandage was applied.   Note: In order to accurately identify the placement of the needle, ultrasound was required, to increase the accuracy, and specificity of the injection.    Follow-up: Return if symptoms worsen or fail to improve.   Subjective:  Chief Complaint  Patient presents with   Shoulder Pain    RIGHT / wants injection     History of Present Illness: Nickolette SAFAA STINGLEY is a 89 y.o. female who returns to clinic for repeat evaluation of her right shoulder.  She continues to have pain in the right shoulder.  I have previously injected the shoulder, but she has had better relief following fluoroscopic guided injections.  She is interested in a glenohumeral joint injection, which we can complete under ultrasound guidance.   Review of Systems: No  fevers or chills No numbness or tingling No chest pain No shortness of breath No bowel or bladder dysfunction No GI distress No headaches   Objective: There were no vitals taken for this visit.  Physical Exam:  Elderly female.  No acute distress.  Alert and oriented.  Seated in a wheelchair  Right shoulder with some swelling anterior.  Mild tenderness to palpation.  Actively, she is unable to get her arm to 90 degrees of forward flexion.  Abduction at her side is limited to 90 degrees.  Fingers are warm and well-perfused.   IMAGING: I personally ordered and reviewed the following images:  No imaging obtained today.  Mordecai Rasmussen, MD 10/11/2022 12:08 PM

## 2022-10-11 NOTE — Patient Instructions (Signed)

## 2022-11-03 ENCOUNTER — Encounter: Payer: Self-pay | Admitting: Radiology

## 2023-01-09 DIAGNOSIS — E1151 Type 2 diabetes mellitus with diabetic peripheral angiopathy without gangrene: Secondary | ICD-10-CM | POA: Diagnosis not present

## 2023-01-09 DIAGNOSIS — B351 Tinea unguium: Secondary | ICD-10-CM | POA: Diagnosis not present

## 2023-01-09 DIAGNOSIS — E1142 Type 2 diabetes mellitus with diabetic polyneuropathy: Secondary | ICD-10-CM | POA: Diagnosis not present

## 2023-01-10 ENCOUNTER — Ambulatory Visit (INDEPENDENT_AMBULATORY_CARE_PROVIDER_SITE_OTHER): Payer: PPO | Admitting: Orthopedic Surgery

## 2023-01-10 DIAGNOSIS — M1711 Unilateral primary osteoarthritis, right knee: Secondary | ICD-10-CM

## 2023-01-10 DIAGNOSIS — M19011 Primary osteoarthritis, right shoulder: Secondary | ICD-10-CM | POA: Diagnosis not present

## 2023-01-10 NOTE — Patient Instructions (Signed)

## 2023-01-10 NOTE — Progress Notes (Signed)
Orthopaedic Clinic Return  Assessment: Whitney Watts is a 89 y.o. female with the following: Right shoulder pain, pseudoparalysis Right knee arthritis.  Plan: [name] has pain in her right shoulder, and her right knee.  She has had excellent responses with injections.  She has pseudoparalysis, and likely complete tear of the rotator cuff.  She would like injections in both the shoulder and the knee.  Will proceed with a subacromial injection, as well as a standard right knee injection.    Procedure note injection - Right shoulder    Verbal consent was obtained to inject the right shoulder, subacromial space Timeout was completed to confirm the site of injection.   The skin was prepped with alcohol and ethyl chloride was sprayed at the injection site.  A 21-gauge needle was used to inject 40 mg of Depo-Medrol and 1% lidocaine (3 cc) into the subacromial space of the right shoulder using a posterolateral approach.  There were no complications.  A sterile bandage was applied.   Procedure note injection Right knee joint   Verbal consent was obtained to inject the right knee joint  Timeout was completed to confirm the site of injection.  The skin was prepped with alcohol and ethyl chloride was sprayed at the injection site.  A 21-gauge needle was used to inject 40 mg of Depo-Medrol and 1% lidocaine (3 cc) into the right knee using an anterolateral approach.  There were no complications. A sterile bandage was applied.     Follow-up: Return if symptoms worsen or fail to improve.   Subjective:  No chief complaint on file.   History of Present Illness: Whitney Watts is a 89 y.o. female who returns to clinic for repeat evaluation of her right shoulder and right knee.  She has pain in both shoulder and the knee.  She has had injections in both.  She has done well.  She would like new injections completed today.  Review of Systems: No fevers or chills No numbness or tingling No  chest pain No shortness of breath No bowel or bladder dysfunction No GI distress No headaches   Objective: There were no vitals taken for this visit.  Physical Exam:  Elderly female.  No acute distress.  Alert and oriented.  Seated in a wheelchair  Right shoulder with some swelling anterior.  Mild tenderness to palpation.  Actively, she is unable to get her arm to 90 degrees of forward flexion.  Abduction at her side is limited to 90 degrees.  Fingers are warm and well-perfused.  Right knee with valgus alignment.  Tenderness palpation over the lateral knee.  She has good range of motion.   IMAGING: I personally ordered and reviewed the following images:  No imaging obtained today.  Oliver Barre, MD 01/10/2023 11:48 AM

## 2023-01-19 IMAGING — DX DG CLAVICLE*R*
2 series · 2 of 2 positions shown · non-contrast
Comparison: Right shoulder radiographs 10/30/2020

CLINICAL DATA: Fall yesterday.

EXAM:
RIGHT SHOULDER - 2+ VIEW; RIGHT CLAVICLE - 2+ VIEWS

[clavicle axial]
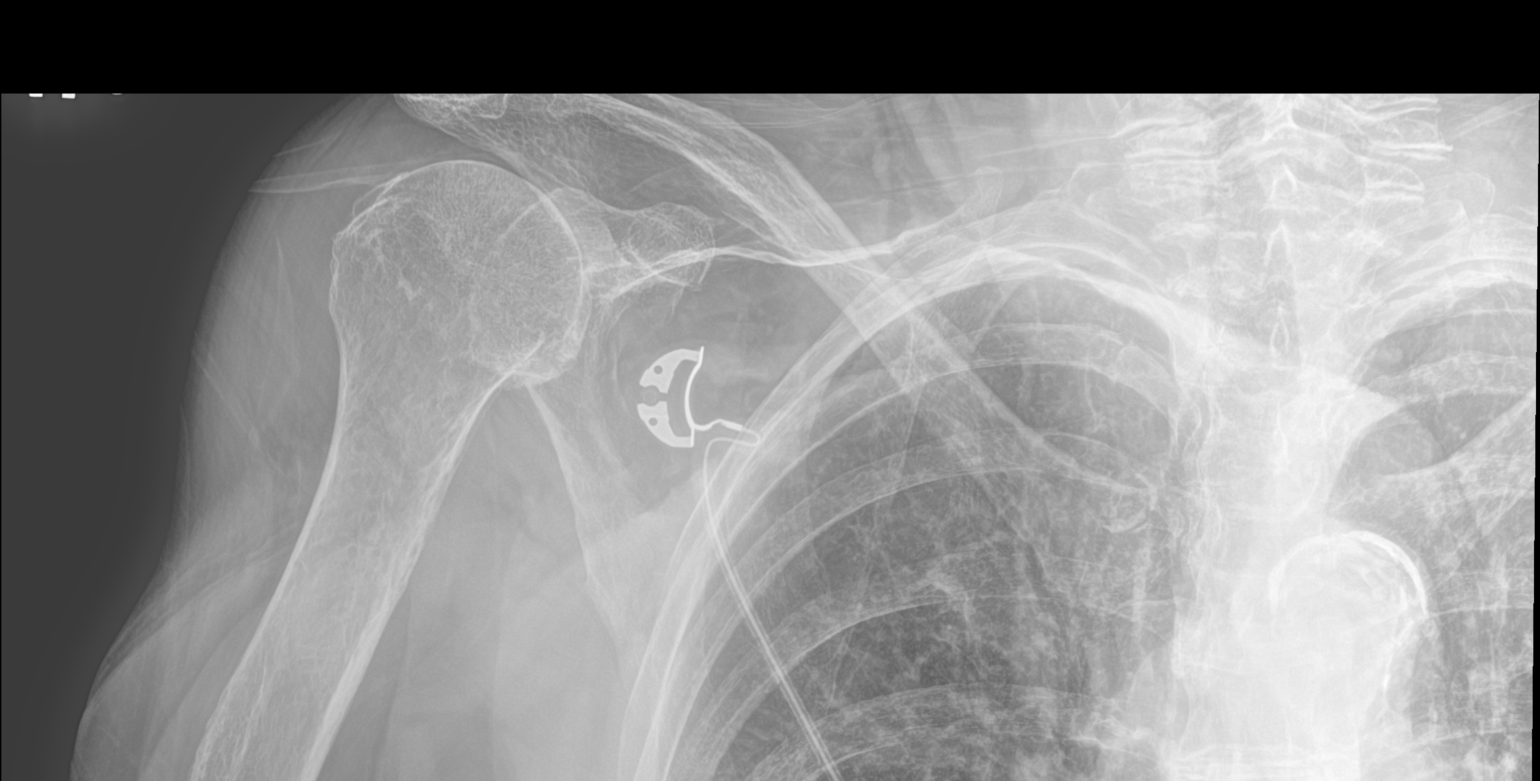

[clavicle ap]
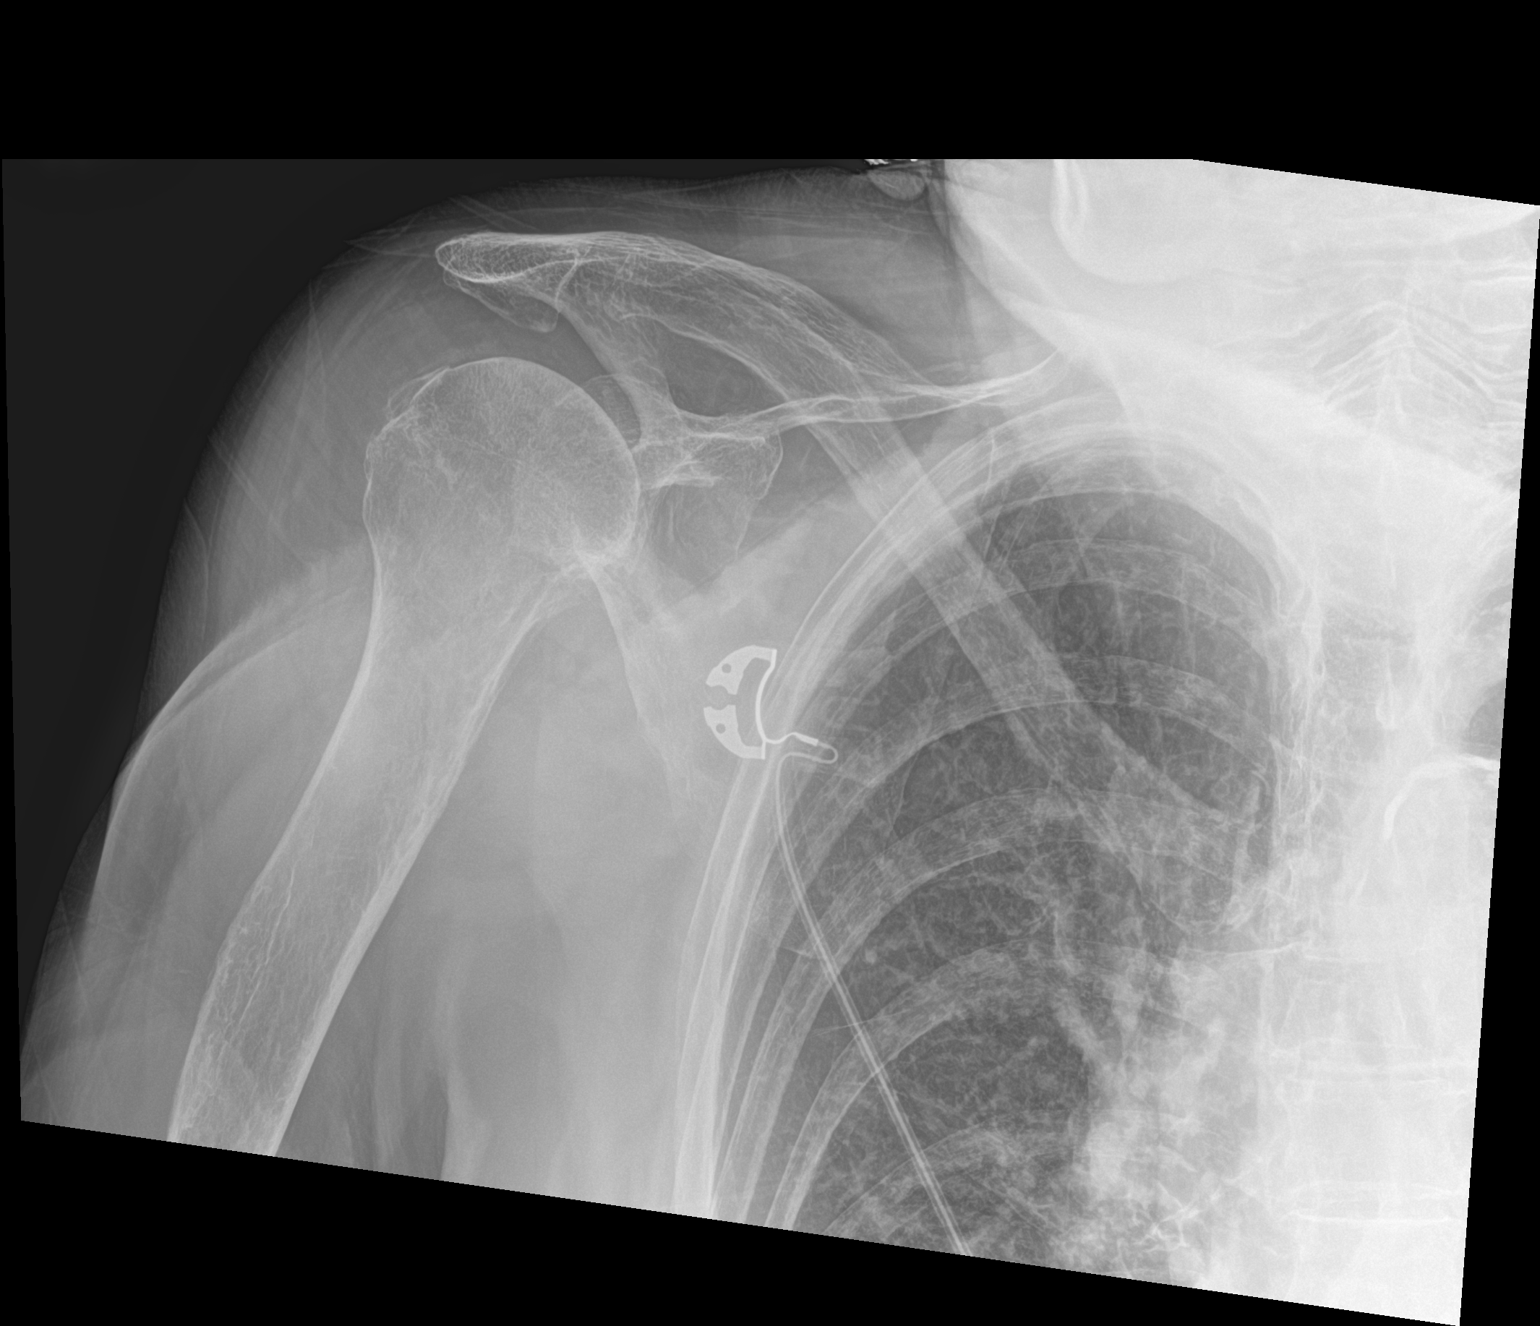

[2 of 2 positions shown; findings below may reference images not displayed]

FINDINGS: There is again diffuse decreased bone mineralization. There is again
severe glenohumeral joint space narrowing and large inferior humeral
head-neck junction degenerative osteophytosis.

Mild acromioclavicular joint space narrowing with moderate
peripheral degenerative osteophytosis. The clavicular head is again
approximately 5 mm superiorly located with respect to the acromion
chronic. No acute fracture or dislocation.

No acute fracture is identified within the right clavicle.

The visualized portion of the right lung is unremarkable. Moderate
calcifications within the thoracic aorta.
IMPRESSION: :
IMPRESSION: 1. Within the limitations of diffuse decreased bone mineralization,
no acute fracture is identified.
2. Severe glenohumeral and mild acromioclavicular osteoarthritis.
3. Unchanged mild elevation of the clavicular head with respect to
the acromion, chronic and possibly degenerative.

## 2023-01-19 IMAGING — DX DG SHOULDER 2+V*R*
4 series · 4 of 4 positions shown · non-contrast
Comparison: Right shoulder radiographs 10/30/2020

CLINICAL DATA: Fall yesterday.

EXAM:
RIGHT SHOULDER - 2+ VIEW; RIGHT CLAVICLE - 2+ VIEWS

[shoulder grashey (1 of 2)]
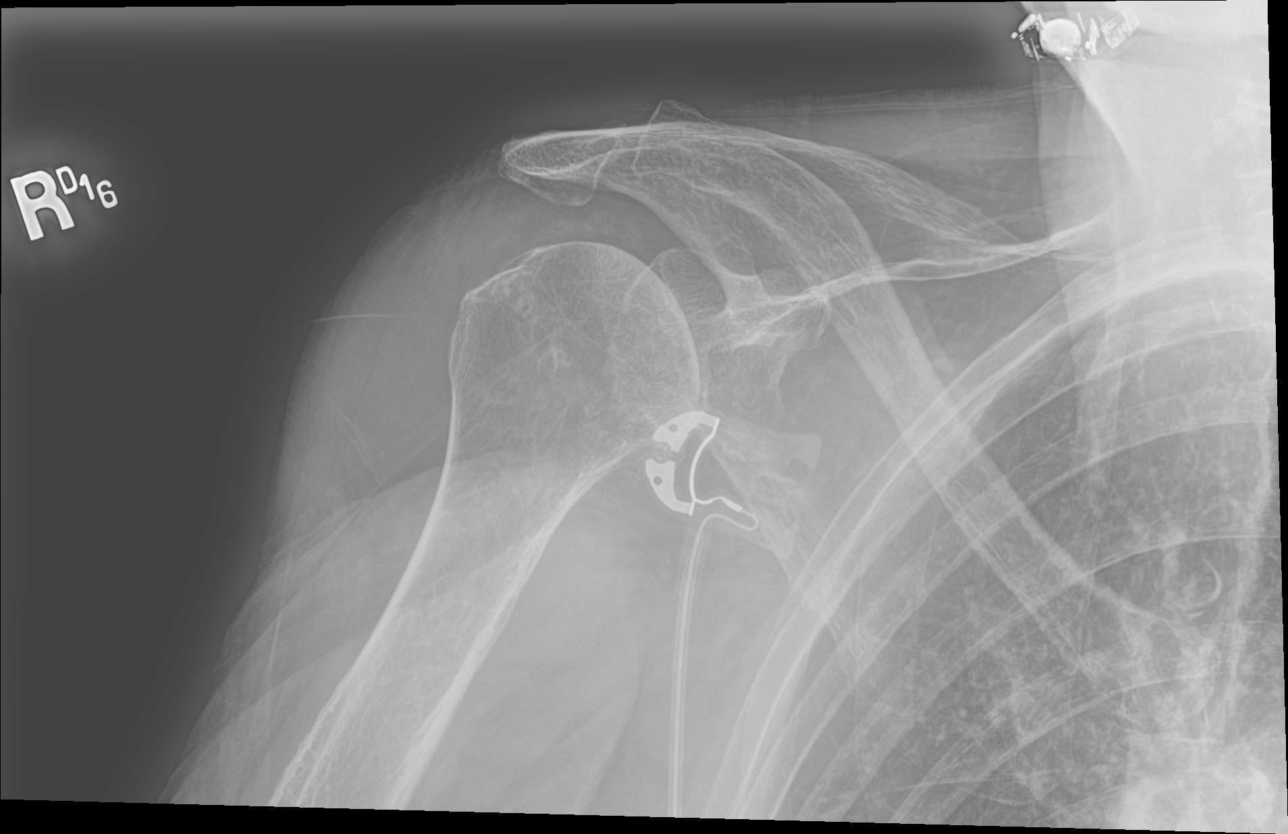

[shoulder y view]
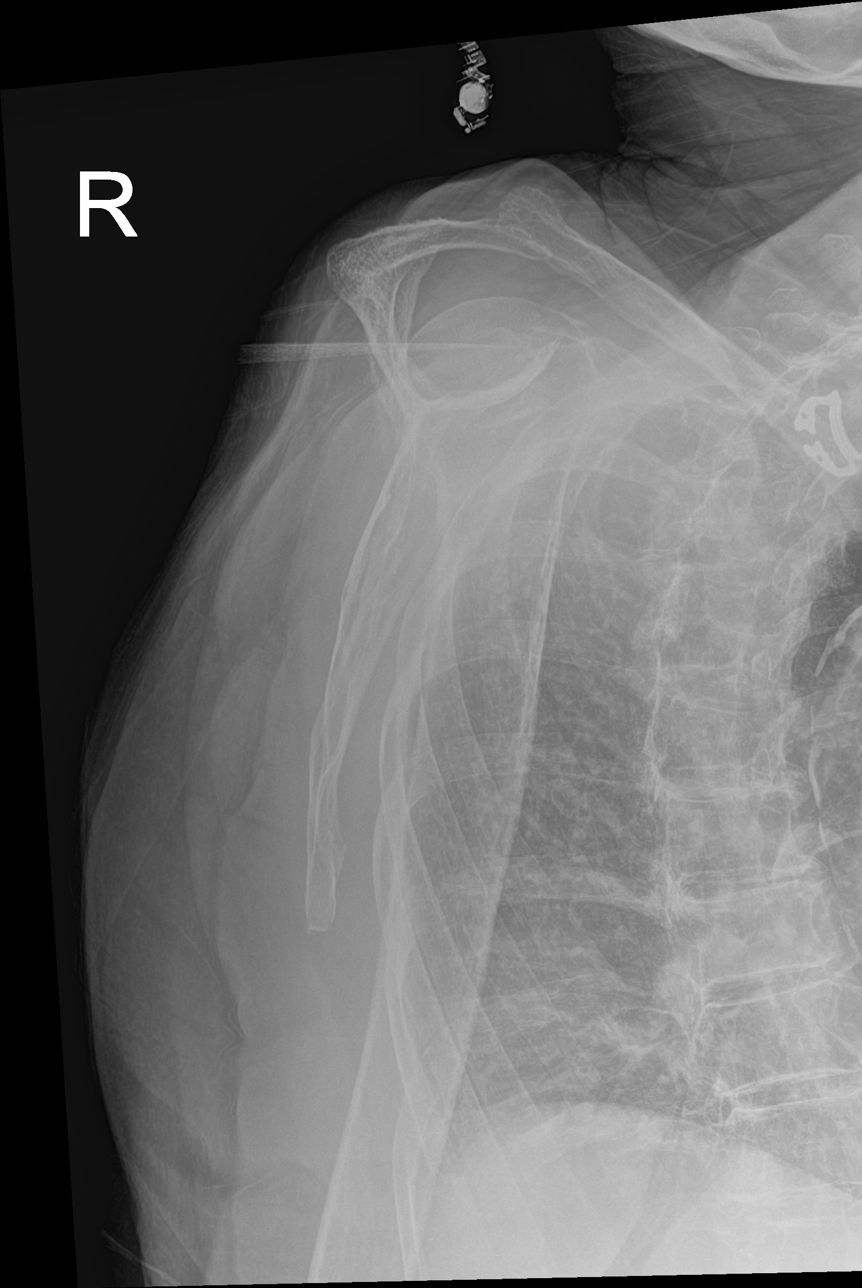

[shoulder ap]
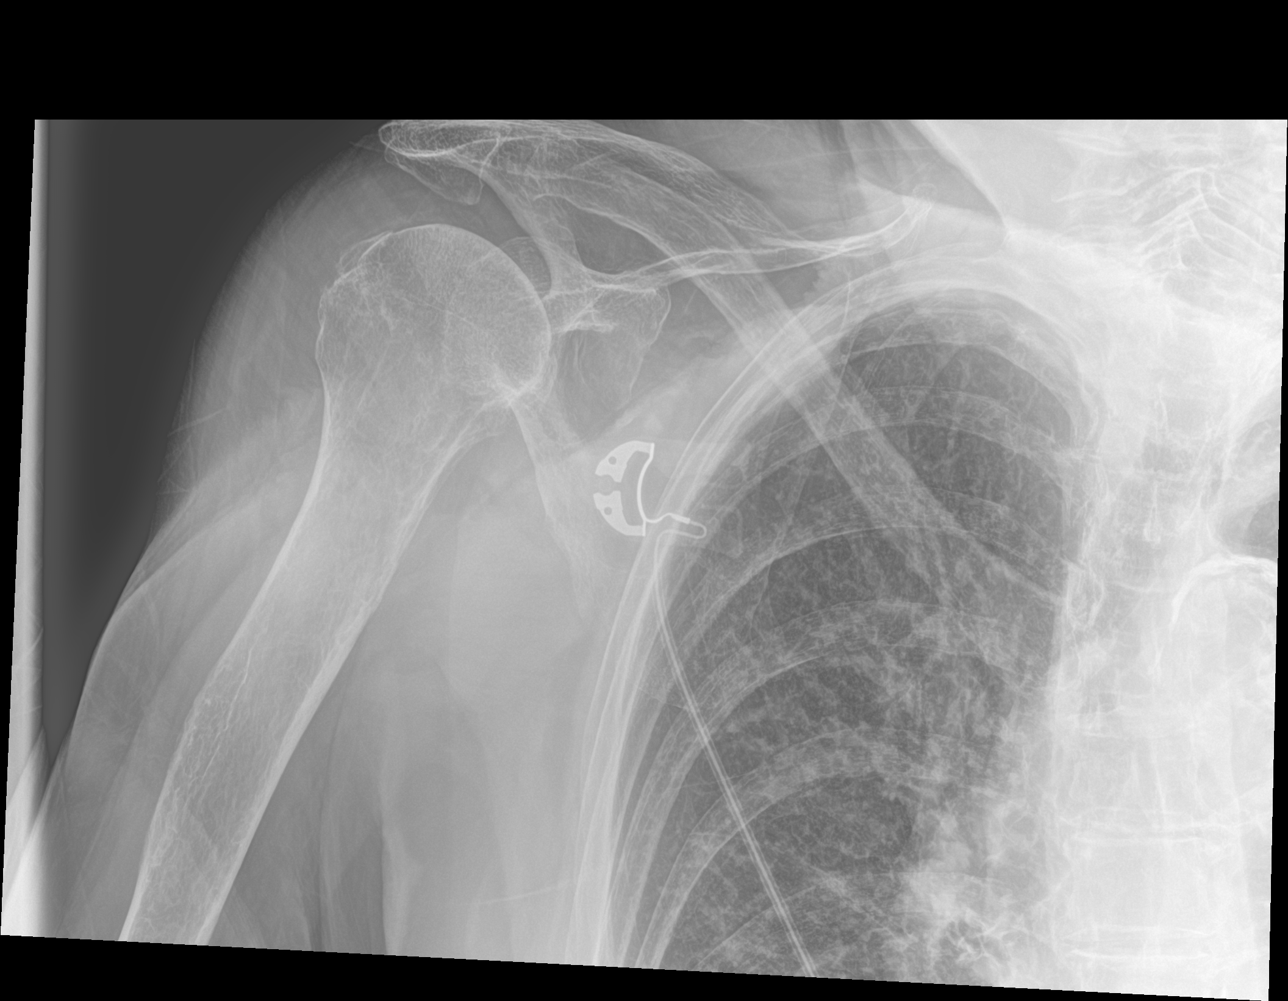

[shoulder grashey (2 of 2)]
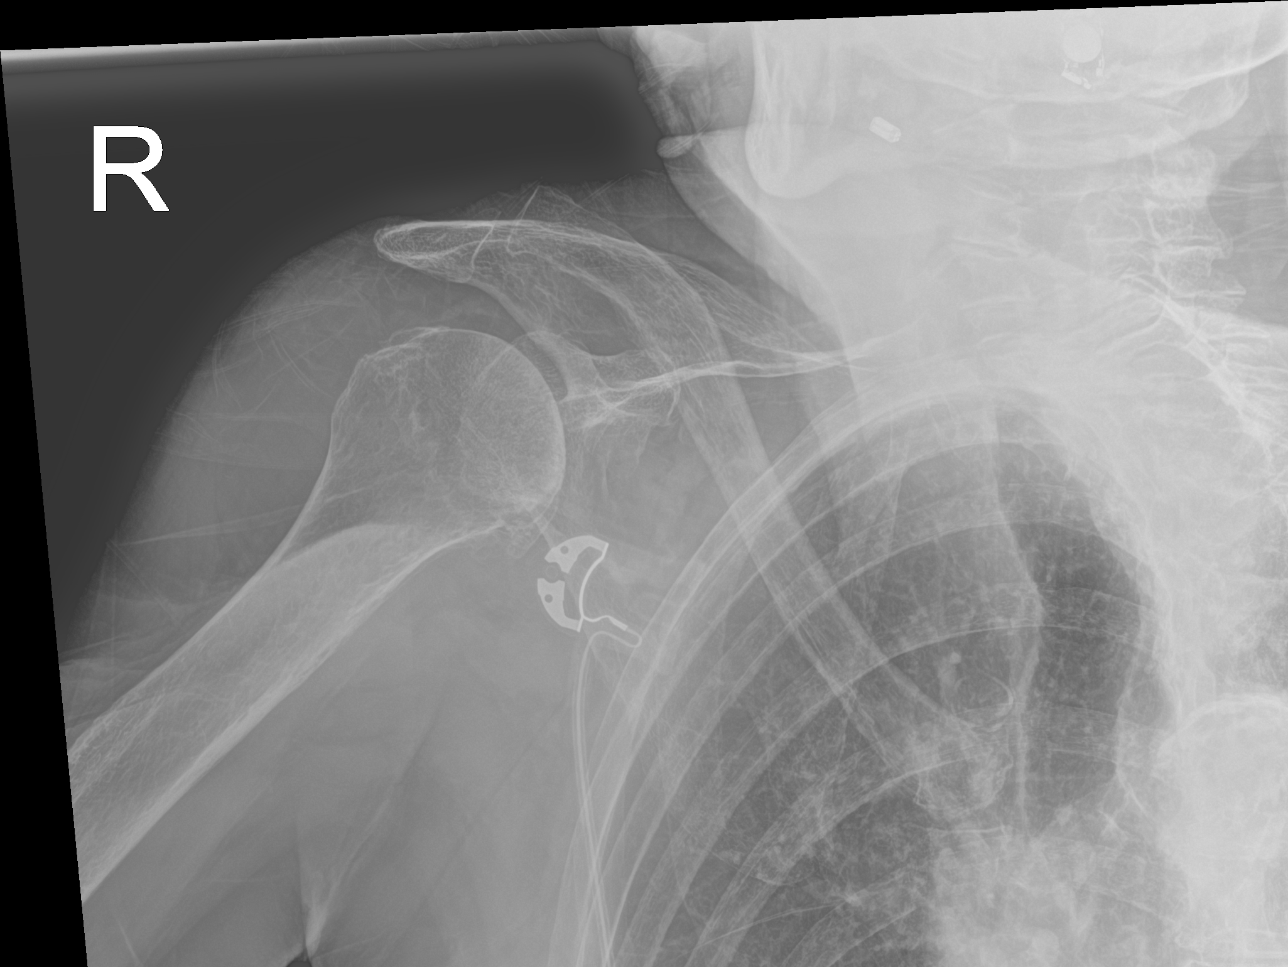

[4 of 4 positions shown; findings below may reference images not displayed]

FINDINGS: There is again diffuse decreased bone mineralization. There is again
severe glenohumeral joint space narrowing and large inferior humeral
head-neck junction degenerative osteophytosis.

Mild acromioclavicular joint space narrowing with moderate
peripheral degenerative osteophytosis. The clavicular head is again
approximately 5 mm superiorly located with respect to the acromion
chronic. No acute fracture or dislocation.

No acute fracture is identified within the right clavicle.

The visualized portion of the right lung is unremarkable. Moderate
calcifications within the thoracic aorta.
IMPRESSION: :
IMPRESSION: 1. Within the limitations of diffuse decreased bone mineralization,
no acute fracture is identified.
2. Severe glenohumeral and mild acromioclavicular osteoarthritis.
3. Unchanged mild elevation of the clavicular head with respect to
the acromion, chronic and possibly degenerative.

## 2023-01-24 DIAGNOSIS — I10 Essential (primary) hypertension: Secondary | ICD-10-CM | POA: Diagnosis not present

## 2023-01-24 DIAGNOSIS — M19019 Primary osteoarthritis, unspecified shoulder: Secondary | ICD-10-CM | POA: Diagnosis not present

## 2023-01-24 DIAGNOSIS — E118 Type 2 diabetes mellitus with unspecified complications: Secondary | ICD-10-CM | POA: Diagnosis not present

## 2023-01-24 DIAGNOSIS — R269 Unspecified abnormalities of gait and mobility: Secondary | ICD-10-CM | POA: Diagnosis not present

## 2023-01-24 DIAGNOSIS — K460 Unspecified abdominal hernia with obstruction, without gangrene: Secondary | ICD-10-CM | POA: Diagnosis not present

## 2023-01-24 DIAGNOSIS — R600 Localized edema: Secondary | ICD-10-CM | POA: Diagnosis not present

## 2023-01-24 DIAGNOSIS — E1169 Type 2 diabetes mellitus with other specified complication: Secondary | ICD-10-CM | POA: Diagnosis not present

## 2023-01-24 DIAGNOSIS — M179 Osteoarthritis of knee, unspecified: Secondary | ICD-10-CM | POA: Diagnosis not present

## 2023-01-24 DIAGNOSIS — Z713 Dietary counseling and surveillance: Secondary | ICD-10-CM | POA: Diagnosis not present

## 2023-01-24 DIAGNOSIS — E782 Mixed hyperlipidemia: Secondary | ICD-10-CM | POA: Diagnosis not present

## 2023-01-24 DIAGNOSIS — M1711 Unilateral primary osteoarthritis, right knee: Secondary | ICD-10-CM | POA: Diagnosis not present

## 2023-01-24 DIAGNOSIS — I700 Atherosclerosis of aorta: Secondary | ICD-10-CM | POA: Diagnosis not present

## 2023-01-31 DIAGNOSIS — E1169 Type 2 diabetes mellitus with other specified complication: Secondary | ICD-10-CM | POA: Diagnosis not present

## 2023-02-01 ENCOUNTER — Encounter (INDEPENDENT_AMBULATORY_CARE_PROVIDER_SITE_OTHER): Payer: Self-pay | Admitting: *Deleted

## 2023-02-10 DIAGNOSIS — M19019 Primary osteoarthritis, unspecified shoulder: Secondary | ICD-10-CM | POA: Diagnosis not present

## 2023-02-10 DIAGNOSIS — E1169 Type 2 diabetes mellitus with other specified complication: Secondary | ICD-10-CM | POA: Diagnosis not present

## 2023-02-10 DIAGNOSIS — G8929 Other chronic pain: Secondary | ICD-10-CM | POA: Diagnosis not present

## 2023-02-10 DIAGNOSIS — M545 Low back pain, unspecified: Secondary | ICD-10-CM | POA: Diagnosis not present

## 2023-02-10 DIAGNOSIS — M1711 Unilateral primary osteoarthritis, right knee: Secondary | ICD-10-CM | POA: Diagnosis not present

## 2023-02-10 DIAGNOSIS — K460 Unspecified abdominal hernia with obstruction, without gangrene: Secondary | ICD-10-CM | POA: Diagnosis not present

## 2023-02-10 DIAGNOSIS — I700 Atherosclerosis of aorta: Secondary | ICD-10-CM | POA: Diagnosis not present

## 2023-02-10 DIAGNOSIS — R600 Localized edema: Secondary | ICD-10-CM | POA: Diagnosis not present

## 2023-02-10 DIAGNOSIS — E782 Mixed hyperlipidemia: Secondary | ICD-10-CM | POA: Diagnosis not present

## 2023-02-10 DIAGNOSIS — I10 Essential (primary) hypertension: Secondary | ICD-10-CM | POA: Diagnosis not present

## 2023-02-10 DIAGNOSIS — R269 Unspecified abnormalities of gait and mobility: Secondary | ICD-10-CM | POA: Diagnosis not present

## 2023-02-10 DIAGNOSIS — D509 Iron deficiency anemia, unspecified: Secondary | ICD-10-CM | POA: Diagnosis not present

## 2023-02-12 ENCOUNTER — Emergency Department (HOSPITAL_COMMUNITY)
Admission: EM | Admit: 2023-02-12 | Discharge: 2023-02-12 | Disposition: A | Discharge status: Home / Self Care | Payer: PPO | Attending: Emergency Medicine | Admitting: Emergency Medicine

## 2023-02-12 ENCOUNTER — Other Ambulatory Visit: Payer: Self-pay

## 2023-02-12 ENCOUNTER — Emergency Department (HOSPITAL_COMMUNITY): Payer: PPO

## 2023-02-12 DIAGNOSIS — Z79899 Other long term (current) drug therapy: Secondary | ICD-10-CM | POA: Insufficient documentation

## 2023-02-12 DIAGNOSIS — K450 Other specified abdominal hernia with obstruction, without gangrene: Secondary | ICD-10-CM | POA: Diagnosis present

## 2023-02-12 DIAGNOSIS — I959 Hypotension, unspecified: Secondary | ICD-10-CM | POA: Diagnosis not present

## 2023-02-12 DIAGNOSIS — R109 Unspecified abdominal pain: Secondary | ICD-10-CM | POA: Diagnosis not present

## 2023-02-12 DIAGNOSIS — D649 Anemia, unspecified: Secondary | ICD-10-CM

## 2023-02-12 DIAGNOSIS — K460 Unspecified abdominal hernia with obstruction, without gangrene: Secondary | ICD-10-CM

## 2023-02-12 DIAGNOSIS — Z7984 Long term (current) use of oral hypoglycemic drugs: Secondary | ICD-10-CM | POA: Insufficient documentation

## 2023-02-12 DIAGNOSIS — I700 Atherosclerosis of aorta: Secondary | ICD-10-CM | POA: Diagnosis not present

## 2023-02-12 LAB — COMPREHENSIVE METABOLIC PANEL
ALT: 13 U/L (ref 0–44)
AST: 15 U/L (ref 15–41)
Albumin: 3.4 g/dL — ABNORMAL LOW (ref 3.5–5.0)
Alkaline Phosphatase: 65 U/L (ref 38–126)
Anion gap: 11 (ref 5–15)
BUN: 20 mg/dL (ref 8–23)
CO2: 25 mmol/L (ref 22–32)
Calcium: 8.7 mg/dL — ABNORMAL LOW (ref 8.9–10.3)
Chloride: 98 mmol/L (ref 98–111)
Creatinine, Ser: 0.41 mg/dL — ABNORMAL LOW (ref 0.44–1.00)
GFR, Estimated: >60 mL/min (ref >60)
Glucose, Bld: 112 mg/dL — ABNORMAL HIGH (ref 70–99)
Potassium: 3.5 mmol/L (ref 3.5–5.1)
Sodium: 134 mmol/L — ABNORMAL LOW (ref 135–145)
Total Bilirubin: 0.6 mg/dL (ref 0.3–1.2)
Total Protein: 6 g/dL — ABNORMAL LOW (ref 6.5–8.1)

## 2023-02-12 LAB — IRON AND TIBC
Iron: 70 ug/dL (ref 28–170)
Saturation Ratios: 16 % (ref 10.4–31.8)
TIBC: 438 ug/dL (ref 250–450)
UIBC: 368 ug/dL

## 2023-02-12 LAB — CBC WITH DIFFERENTIAL/PLATELET
Abs Immature Granulocytes: 0.02 10*3/uL (ref 0.00–0.07)
Basophils Absolute: 0 10*3/uL (ref 0.0–0.1)
Basophils Relative: 0 %
Eosinophils Absolute: 0 10*3/uL (ref 0.0–0.5)
Eosinophils Relative: 0 %
HCT: 26.2 % — ABNORMAL LOW (ref 36.0–46.0)
Hemoglobin: 8.2 g/dL — ABNORMAL LOW (ref 12.0–15.0)
Immature Granulocytes: 0 %
Lymphocytes Relative: 7 %
Lymphs Abs: 0.5 10*3/uL — ABNORMAL LOW (ref 0.7–4.0)
MCH: 23.9 pg — ABNORMAL LOW (ref 26.0–34.0)
MCHC: 31.3 g/dL (ref 30.0–36.0)
MCV: 76.4 fL — ABNORMAL LOW (ref 80.0–100.0)
Monocytes Absolute: 0.2 10*3/uL (ref 0.1–1.0)
Monocytes Relative: 3 %
Neutro Abs: 6.4 10*3/uL (ref 1.7–7.7)
Neutrophils Relative %: 90 %
Platelets: 310 10*3/uL (ref 150–400)
RBC: 3.43 MIL/uL — ABNORMAL LOW (ref 3.87–5.11)
RDW: 18.2 % — ABNORMAL HIGH (ref 11.5–15.5)
WBC: 7.1 10*3/uL (ref 4.0–10.5)
nRBC: 0 % (ref 0.0–0.2)

## 2023-02-12 LAB — OCCULT BLOOD X 1 CARD TO LAB, STOOL: Fecal Occult Bld: POSITIVE — AB

## 2023-02-12 LAB — FOLATE: Folate: 11.2 ng/mL (ref >5.9)

## 2023-02-12 LAB — FERRITIN: Ferritin: 8 ng/mL — ABNORMAL LOW (ref 11–307)

## 2023-02-12 LAB — RETICULOCYTES
Immature Retic Fract: 30.5 % — ABNORMAL HIGH (ref 2.3–15.9)
RBC.: 3.46 MIL/uL — ABNORMAL LOW (ref 3.87–5.11)
Retic Count, Absolute: 45 10*3/uL (ref 19.0–186.0)
Retic Ct Pct: 1.3 % (ref 0.4–3.1)

## 2023-02-12 LAB — VITAMIN B12: Vitamin B-12: 183 pg/mL (ref 180–914)

## 2023-02-12 LAB — POC OCCULT BLOOD, ED: Fecal Occult Bld: POSITIVE — AB

## 2023-02-12 MED ORDER — IOHEXOL 300 MG/ML  SOLN
100.0000 mL | Freq: Once | INTRAMUSCULAR | Status: AC | PRN
  Administered 2023-02-12: 100 mL via INTRAVENOUS

## 2023-02-12 MED ORDER — PANTOPRAZOLE SODIUM 40 MG PO TBEC: 40.0000 mg | DELAYED_RELEASE_TABLET | Freq: Every day | ORAL | 0 refills | Status: AC

## 2023-02-12 MED ORDER — FENTANYL CITRATE PF 50 MCG/ML IJ SOSY
25.0000 ug | PREFILLED_SYRINGE | Freq: Once | INTRAMUSCULAR | Status: AC
  Administered 2023-02-12: 25 ug via INTRAVENOUS
  Filled 2023-02-12: qty 1

## 2023-02-12 NOTE — ED Notes (Signed)
Occult blood positive--MD made aware

## 2023-02-12 NOTE — ED Provider Notes (Signed)
[facility] Provider Note   CSN: [id number] Arrival date & time: 02/12/23  1441     History  Chief Complaint  Patient presents with   Hernia    Whitney Watts is a 89 y.o. female. He has past medical history of anemia currently being worked up by her stay PCP, he is on iron supplementation.  Today because she felt her hernia come out. HPI     Home Medications Prior to Admission medications   Medication Sig Start Date End Date Taking? Authorizing Provider  pantoprazole (PROTONIX) 40 MG tablet Take 1 tablet (40 mg total) by mouth daily. 02/12/23  Yes Azlin Zilberman A, PA-C  acetaminophen (TYLENOL) 500 MG tablet Take 500 mg by mouth every 6 (six) hours as needed for mild pain.    [provider]  amLODipine (NORVASC) 10 MG tablet Take 10 mg by mouth daily.    [provider]  docusate sodium (COLACE) 100 MG capsule Take 1 capsule (100 mg total) by mouth 2 (two) times daily. 02/23/22 02/23/23  ShahmehdiGemma Payor, MD  metFORMIN (GLUCOPHAGE-XR) 500 MG 24 hr tablet Take 1,000 mg by mouth 2 (two) times daily. 04/23/20   [provider]  traMADol (ULTRAM) 50 MG tablet TAKE ONE TABLET BY MOUTH EVERY 6 HOURS AS NEEDED Patient taking differently: Take 50 mg by mouth every 6 (six) hours as needed for moderate pain. 11/10/21   Oliver Barre, MD      Allergies    Bee venom, Pravastatin sodium, and Rosuvastatin    Review of Systems   Review of Systems  Physical Exam Updated Vital Signs BP 125/65 (BP Location: Left Arm)   Pulse 69   Temp 98 F (36.7 C)   Resp 17   SpO2 96%  Physical Exam Vitals and nursing note reviewed.  Constitutional:      General: She is not in acute distress.    Appearance: She is well-developed.  HENT:     Head: Normocephalic and atraumatic.     Mouth/Throat:     Mouth: Mucous membranes are moist.  Eyes:     Conjunctiva/sclera: Conjunctivae normal.  Cardiovascular:     Rate and Rhythm:  Normal rate and regular rhythm.     Heart sounds: No murmur heard. Pulmonary:     Effort: Pulmonary effort is normal. No respiratory distress.     Breath sounds: Normal breath sounds.  Abdominal:     Palpations: Abdomen is soft.     Tenderness: There is no abdominal tenderness.     Comments: Hernia noted with tenderness at umbilicus but no discoloration, no crepitus  Musculoskeletal:        General: No swelling.     Cervical back: Neck supple.  Skin:    General: Skin is warm and dry.     Capillary Refill: Capillary refill takes less than 2 seconds.  Neurological:     General: No focal deficit present.     Mental Status: She is alert and oriented to person, place, and time.  Psychiatric:        Mood and Affect: Mood normal.     ED Results / Procedures / Treatments   Labs (all labs ordered are listed, but only abnormal results are displayed) Labs Reviewed  COMPREHENSIVE METABOLIC PANEL - Abnormal; Notable for the following components:      Result Value   Sodium 134 (*)    Glucose, Bld 112 (*)    Creatinine, Ser  0.41 (*)    Calcium 8.7 (*)    Total Protein 6.0 (*)    Albumin 3.4 (*)    All other components within normal limits  CBC WITH DIFFERENTIAL/PLATELET - Abnormal; Notable for the following components:   RBC 3.43 (*)    Hemoglobin 8.2 (*)    HCT 26.2 (*)    MCV 76.4 (*)    MCH 23.9 (*)    RDW 18.2 (*)    Lymphs Abs 0.5 (*)    All other components within normal limits  FERRITIN - Abnormal; Notable for the following components:   Ferritin 8 (*)    All other components within normal limits  RETICULOCYTES - Abnormal; Notable for the following components:   RBC. 3.46 (*)    Immature Retic Fract 30.5 (*)    All other components within normal limits  OCCULT BLOOD X 1 CARD TO LAB, STOOL - Abnormal; Notable for the following components:   Fecal Occult Bld POSITIVE (*)    All other components within normal limits  POC OCCULT BLOOD, ED - Abnormal; Notable for the  following components:   Fecal Occult Bld POSITIVE (*)    All other components within normal limits  VITAMIN B12  FOLATE  IRON AND TIBC  CBC WITH DIFFERENTIAL/PLATELET    EKG None  Radiology CT ABDOMEN PELVIS W CONTRAST  Result Date: 02/12/2023 CLINICAL DATA:  Abdominal pain, umbilical hernia EXAM: CT ABDOMEN AND PELVIS WITH CONTRAST TECHNIQUE: Multidetector CT imaging of the abdomen and pelvis was performed using the standard protocol following bolus administration of intravenous contrast. RADIATION DOSE REDUCTION: This exam was performed according to the departmental dose-optimization program which includes automated exposure control, adjustment of the mA and/or kV according to patient size and/or use of iterative reconstruction technique. CONTRAST:  OMNIPAQUE IOHEXOL 300 MG/ML  SOLN COMPARISON:  CT abdomen pelvis, 02/22/2022 FINDINGS: Lower chest: No acute abnormality. Three-vessel coronary artery calcifications. Dependent bibasilar scarring or atelectasis. Hepatobiliary: No solid liver abnormality is seen. Multiple rim calcified gallstones. Unchanged intra and extrahepatic biliary ductal dilatation. The central common bile duct measures up to 0.9 cm in caliber without calculus or other obstruction identified to the ampulla. Pancreas: Unremarkable. No pancreatic ductal dilatation or surrounding inflammatory changes. Spleen: Normal in size without significant abnormality. Adrenals/Urinary Tract: Adrenal glands are unremarkable. Kidneys are normal, without renal calculi, solid lesion, or hydronephrosis. Bladder is unremarkable. Stomach/Bowel: Stomach is within normal limits. Appendix is not clearly visualized. Single loop of mid small bowel within umbilical hernia. The small bowel proximal to the hernia is fluid-filled although not overtly distended, measuring up to 3.0 cm in caliber. Pancolonic diverticulosis, severe in the sigmoid. Vascular/Lymphatic: Aortic atherosclerosis. No enlarged  abdominal or pelvic lymph nodes. Reproductive: No mass or other significant abnormality. Other: Umbilical hernia containing fluid and a single loop of mid small bowel, hernia sac measuring 4.6 x 2.9 cm, hernia neck 1.5 cm (series 2, image 45). Small volume ascites. Musculoskeletal: No acute or significant osseous findings. IMPRESSION: 1. Umbilical hernia containing fluid and a single loop of mid small bowel, hernia sac measuring 4.6 x 2.9 cm, hernia neck 1.5 cm. 2. The small bowel proximal to the hernia is fluid-filled although not overtly distended, measuring up to 3.0 cm in caliber, concerning for partial or developing small bowel obstruction. 3. Small volume ascites. 4. Pancolonic diverticulosis, severe in the sigmoid. 5. Cholelithiasis. 6. Unchanged intra and extrahepatic biliary ductal dilatation. The central common bile duct measures up to 0.9 cm in caliber without  calculus or other obstruction identified to the ampulla. 7. Coronary artery disease. Aortic Atherosclerosis (ICD10-I70.0). Electronically Signed   By: Jearld Lesch M.D.   On: 02/12/2023 17:08    Procedures Procedures    Medications Ordered in ED Medications  fentaNYL (SUBLIMAZE) injection 25 mcg (25 mcg Intravenous Given 02/12/23 1551)  iohexol (OMNIPAQUE) 300 MG/ML solution 100 mL (100 mLs Intravenous Contrast Given 02/12/23 1643)    ED Course/ Medical Decision Making/ A&P Clinical Course as of 02/12/23 1947  Sun Feb 12, 2023  1744 CBC with Differential/Platelet(!) [CB]    Clinical Course User Index [CB] Ma Rings, PA-C                             Medical Decision Making DDx: Incarcerated hernia, bowel obstruction, other Course: Patient here when her hernia came out last night in her umbilical area, not able to reduce it at home, she did have 1 episode of emesis.  I was unable to reduce hernia initially at bedside due to pain, patient given pain medicine labs and CT ordered.  CT shows hernia with bowel loop in the  umbilical region with fluid-filled small bowel concerning for possible early or partial obstruction.  I agree with radiology read, they also noted gallstones and coronary artery disease  Labs showed anemia with hemoglobin of 8.2, platelets are normal, she is not on blood thinners, stool is brown but guaiac positive.  I was able to use the hernia after patient had pain control without difficulty.  She is feeling much better and requesting to go home.  Discussed follow-up with general surgery to see if they would want to repair this, though patient does not feel she is interested in repair.  Discussed strict return precautions. Patient also given referral to GI for her anemia with guaiac positive stool.  She is on iron so that this could be false positive, regardless put her on a PPI and referred to GI.  Already on iron supplementation and followed by her PCP for this anemia.  Amount and/or Complexity of Data Reviewed Labs: ordered. Decision-making details documented in ED Course. Radiology: ordered and independent interpretation performed. Decision-making details documented in ED Course.  Risk Prescription drug management.           Final Clinical Impression(s) / ED Diagnoses Final diagnoses:  Incarcerated hernia  Anemia, unspecified type    Rx / DC Orders ED Discharge Orders          Ordered    Anemia panel  Status:  Canceled        02/12/23 1746    pantoprazole (PROTONIX) 40 MG tablet  Daily        02/12/23 1759              Josem Kaufmann 02/12/23 Louann Liv, MD 02/14/23 1157

## 2023-02-12 NOTE — ED Triage Notes (Signed)
Pt arrived via [facility] c/o umbilical "hernia that popped out this morning" rates pain 7/10

## 2023-02-12 NOTE — Discharge Instructions (Addendum)
It was a pleasure taking care of you today. You were seen today for an incarcerated hernia.  Fortunately we were able to reduce the hernia.  You still need to follow-up with surgery to see if they want to repair this to prevent it from recurring, or becoming larger and causing more problems in the future.  Your hemoglobin is also low.  You are already seeing your primary care doctor for this.  Your stool sample was positive for blood, so is important for you to follow-up with a GI doctor.  You are started you on a medication that may help if the cause is bleeding in your stomach.  Turn to the ER if you have increased stomach pain, vomiting, hernia, tobacco and cannot be pushed back pain or if you have any other new or worsening symptoms.

## 2023-02-14 ENCOUNTER — Emergency Department (HOSPITAL_COMMUNITY): Payer: PPO

## 2023-02-14 ENCOUNTER — Encounter (HOSPITAL_COMMUNITY): Payer: Self-pay | Admitting: *Deleted

## 2023-02-14 ENCOUNTER — Other Ambulatory Visit: Payer: Self-pay

## 2023-02-14 ENCOUNTER — Emergency Department (HOSPITAL_COMMUNITY)
Admission: EM | Admit: 2023-02-14 | Discharge: 2023-02-14 | Disposition: A | Discharge status: Home / Self Care | Payer: PPO | Attending: Emergency Medicine | Admitting: Emergency Medicine

## 2023-02-14 DIAGNOSIS — K429 Umbilical hernia without obstruction or gangrene: Secondary | ICD-10-CM | POA: Diagnosis not present

## 2023-02-14 DIAGNOSIS — E876 Hypokalemia: Secondary | ICD-10-CM | POA: Insufficient documentation

## 2023-02-14 DIAGNOSIS — R1033 Periumbilical pain: Secondary | ICD-10-CM | POA: Diagnosis present

## 2023-02-14 DIAGNOSIS — R109 Unspecified abdominal pain: Secondary | ICD-10-CM | POA: Diagnosis not present

## 2023-02-14 DIAGNOSIS — K8020 Calculus of gallbladder without cholecystitis without obstruction: Secondary | ICD-10-CM | POA: Diagnosis not present

## 2023-02-14 DIAGNOSIS — K5730 Diverticulosis of large intestine without perforation or abscess without bleeding: Secondary | ICD-10-CM | POA: Diagnosis not present

## 2023-02-14 DIAGNOSIS — K469 Unspecified abdominal hernia without obstruction or gangrene: Secondary | ICD-10-CM | POA: Diagnosis not present

## 2023-02-14 LAB — COMPREHENSIVE METABOLIC PANEL
ALT: 13 U/L (ref 0–44)
AST: 15 U/L (ref 15–41)
Albumin: 3.3 g/dL — ABNORMAL LOW (ref 3.5–5.0)
Alkaline Phosphatase: 63 U/L (ref 38–126)
Anion gap: 10 (ref 5–15)
BUN: 10 mg/dL (ref 8–23)
CO2: 24 mmol/L (ref 22–32)
Calcium: 8.7 mg/dL — ABNORMAL LOW (ref 8.9–10.3)
Chloride: 103 mmol/L (ref 98–111)
Creatinine, Ser: 0.47 mg/dL (ref 0.44–1.00)
GFR, Estimated: >60 mL/min (ref >60)
Glucose, Bld: 110 mg/dL — ABNORMAL HIGH (ref 70–99)
Potassium: 3 mmol/L — ABNORMAL LOW (ref 3.5–5.1)
Sodium: 137 mmol/L (ref 135–145)
Total Bilirubin: 0.6 mg/dL (ref 0.3–1.2)
Total Protein: 6.2 g/dL — ABNORMAL LOW (ref 6.5–8.1)

## 2023-02-14 LAB — CBC WITH DIFFERENTIAL/PLATELET
Abs Immature Granulocytes: 0.01 10*3/uL (ref 0.00–0.07)
Basophils Absolute: 0 10*3/uL (ref 0.0–0.1)
Basophils Relative: 1 %
Eosinophils Absolute: 0 10*3/uL (ref 0.0–0.5)
Eosinophils Relative: 1 %
HCT: 28.6 % — ABNORMAL LOW (ref 36.0–46.0)
Hemoglobin: 8.7 g/dL — ABNORMAL LOW (ref 12.0–15.0)
Immature Granulocytes: 0 %
Lymphocytes Relative: 21 %
Lymphs Abs: 0.9 10*3/uL (ref 0.7–4.0)
MCH: 23.2 pg — ABNORMAL LOW (ref 26.0–34.0)
MCHC: 30.4 g/dL (ref 30.0–36.0)
MCV: 76.3 fL — ABNORMAL LOW (ref 80.0–100.0)
Monocytes Absolute: 0.3 10*3/uL (ref 0.1–1.0)
Monocytes Relative: 6 %
Neutro Abs: 3.2 10*3/uL (ref 1.7–7.7)
Neutrophils Relative %: 71 %
Platelets: 357 10*3/uL (ref 150–400)
RBC: 3.75 MIL/uL — ABNORMAL LOW (ref 3.87–5.11)
RDW: 18.5 % — ABNORMAL HIGH (ref 11.5–15.5)
WBC: 4.5 10*3/uL (ref 4.0–10.5)
nRBC: 0 % (ref 0.0–0.2)

## 2023-02-14 LAB — LIPASE, BLOOD: Lipase: 27 U/L (ref 11–51)

## 2023-02-14 LAB — LACTIC ACID, PLASMA: Lactic Acid, Venous: 1.1 mmol/L (ref 0.5–1.9)

## 2023-02-14 MED ORDER — SODIUM CHLORIDE 0.9 % IV BOLUS
500.0000 mL | Freq: Once | INTRAVENOUS | Status: AC
  Administered 2023-02-14: 500 mL via INTRAVENOUS

## 2023-02-14 MED ORDER — POTASSIUM CHLORIDE CRYS ER 20 MEQ PO TBCR
40.0000 meq | EXTENDED_RELEASE_TABLET | Freq: Once | ORAL | Status: AC
  Administered 2023-02-14: 40 meq via ORAL
  Filled 2023-02-14: qty 2

## 2023-02-14 MED ORDER — IOHEXOL 300 MG/ML  SOLN
100.0000 mL | Freq: Once | INTRAMUSCULAR | Status: AC | PRN
  Administered 2023-02-14: 100 mL via INTRAVENOUS

## 2023-02-14 MED ORDER — POTASSIUM CHLORIDE CRYS ER 20 MEQ PO TBCR: 20.0000 meq | EXTENDED_RELEASE_TABLET | Freq: Every day | ORAL | 0 refills | Status: DC

## 2023-02-14 NOTE — Discharge Instructions (Signed)
If you develop worsening, continued, or recurrent abdominal pain, uncontrolled vomiting, fever, chest or back pain, or any other new/concerning symptoms then return to the ER for evaluation.  

## 2023-02-14 NOTE — ED Provider Notes (Signed)
[place] EMERGENCY DEPARTMENT AT [facility] Provider Note   CSN: [id number] Arrival date & time: 02/14/23  4098     History  Chief Complaint  Patient presents with   Abdominal Pain    Whitney Watts is a 89 y.o. female.  HPI 89 year old female presents with abdominal pain around her umbilical hernia.  Symptoms started sometime after midnight in the middle of the night.  Does not quite feel like when she was here 3 days ago when it was bulging and had to be reduced but there is pain around the hernia.  She has not had any vomiting and is having normal bowel movements.  However she did not feel like eating this morning.  She states it does not really have a significant amount of pain but it is uncomfortable.  No chest pain or shortness of breath.  She has not noticed any blood in the stools.  Home Medications Prior to Admission medications   Medication Sig Start Date End Date Taking? Authorizing Provider  potassium chloride SA (KLOR-CON M) 20 MEQ tablet Take 1 tablet (20 mEq total) by mouth daily. 02/14/23  Yes Pricilla Loveless, MD  acetaminophen (TYLENOL) 500 MG tablet Take 500 mg by mouth every 6 (six) hours as needed for mild pain.    [provider]  amLODipine (NORVASC) 10 MG tablet Take 10 mg by mouth daily.    [provider]  docusate sodium (COLACE) 100 MG capsule Take 1 capsule (100 mg total) by mouth 2 (two) times daily. 02/23/22 02/23/23  ShahmehdiGemma Payor, MD  metFORMIN (GLUCOPHAGE-XR) 500 MG 24 hr tablet Take 1,000 mg by mouth 2 (two) times daily. 04/23/20   [provider]  pantoprazole (PROTONIX) 40 MG tablet Take 1 tablet (40 mg total) by mouth daily. 02/12/23   Carmel Sacramento A, PA-C  traMADol (ULTRAM) 50 MG tablet TAKE ONE TABLET BY MOUTH EVERY 6 HOURS AS NEEDED Patient taking differently: Take 50 mg by mouth every 6 (six) hours as needed for moderate pain. 11/10/21   Oliver Barre, MD      Allergies    Bee venom, Pravastatin sodium,  and Rosuvastatin    Review of Systems   Review of Systems  Respiratory:  Negative for shortness of breath.   Cardiovascular:  Negative for chest pain.  Gastrointestinal:  Positive for abdominal pain. Negative for abdominal distention, blood in stool, constipation, diarrhea and vomiting.    Physical Exam Updated Vital Signs BP 139/83   Pulse 73   Temp 98.1 F (36.7 C) (Oral)   Resp 18   Ht 5\' 6"  (1.676 m)   Wt 58.1 kg   SpO2 96%   BMI 20.66 kg/m  Physical Exam Vitals and nursing note reviewed.  Constitutional:      General: She is not in acute distress.    Appearance: She is well-developed. She is not ill-appearing or diaphoretic.  HENT:     Head: Normocephalic and atraumatic.  Cardiovascular:     Rate and Rhythm: Normal rate and regular rhythm.     Heart sounds: Normal heart sounds.  Pulmonary:     Effort: Pulmonary effort is normal.     Breath sounds: Normal breath sounds.  Abdominal:     Palpations: Abdomen is soft.     Tenderness: There is abdominal tenderness in the periumbilical area.     Comments: Has an umbilical hernia. There is a tenderness around this but no incarceration  Skin:    General: Skin  is warm and dry.  Neurological:     Mental Status: She is alert.     ED Results / Procedures / Treatments   Labs (all labs ordered are listed, but only abnormal results are displayed) Labs Reviewed  COMPREHENSIVE METABOLIC PANEL - Abnormal; Notable for the following components:      Result Value   Potassium 3.0 (*)    Glucose, Bld 110 (*)    Calcium 8.7 (*)    Total Protein 6.2 (*)    Albumin 3.3 (*)    All other components within normal limits  CBC WITH DIFFERENTIAL/PLATELET - Abnormal; Notable for the following components:   RBC 3.75 (*)    Hemoglobin 8.7 (*)    HCT 28.6 (*)    MCV 76.3 (*)    MCH 23.2 (*)    RDW 18.5 (*)    All other components within normal limits  LIPASE, BLOOD  LACTIC ACID, PLASMA    EKG None  Radiology CT ABDOMEN PELVIS  W CONTRAST  Result Date: 02/14/2023 CLINICAL DATA:  Abdominal pain, hernia EXAM: CT ABDOMEN AND PELVIS WITH CONTRAST TECHNIQUE: Multidetector CT imaging of the abdomen and pelvis was performed using the standard protocol following bolus administration of intravenous contrast. RADIATION DOSE REDUCTION: This exam was performed according to the departmental dose-optimization program which includes automated exposure control, adjustment of the mA and/or kV according to patient size and/or use of iterative reconstruction technique. CONTRAST:  OMNIPAQUE IOHEXOL 300 MG/ML  SOLN COMPARISON:  02/12/2023 FINDINGS: Lower chest: Subsegmental right basilar atelectasis. Heart size is normal. Coronary artery atherosclerosis. Hepatobiliary: Cholelithiasis. No pericholecystic inflammatory changes. Stable hepatic cysts. No new focal liver abnormality. Chronic intra and extrahepatic biliary dilatation, unchanged. Pancreas: Unremarkable. No pancreatic ductal dilatation or surrounding inflammatory changes. Spleen: Normal in size without focal abnormality. Adrenals/Urinary Tract: Adrenal glands are unremarkable. Kidneys are normal, without renal calculi, focal lesion, or hydronephrosis. Urinary bladder is moderately dilated but appears otherwise unremarkable. Stomach/Bowel: Stomach within normal limits. No abnormally dilated loops of bowel. Previously seen small bowel containing ventral hernia has been reduced. Colonic diverticulosis. No focal bowel wall thickening or inflammatory changes. Vascular/Lymphatic: Aortic atherosclerosis. No enlarged abdominal or pelvic lymph nodes. Reproductive: Status post hysterectomy. No adnexal masses. Other: Previously seen umbilical hernia has been reduced. Small volume free fluid within the pelvis, nonspecific. No organized abdominopelvic fluid collection. No pneumoperitoneum. Musculoskeletal: Dextroscoliotic curvature of the lumbar spine. No new or acute bony abnormality. IMPRESSION: 1. No  acute abdominopelvic findings. 2. Previously seen small bowel containing ventral hernia has been reduced. No evidence of bowel obstruction. 3. Small volume free fluid within the pelvis, nonspecific. 4. Cholelithiasis without evidence of acute cholecystitis. 5. Colonic diverticulosis without evidence of acute diverticulitis. 6. Aortic atherosclerosis (ICD10-I70.0). Electronically Signed   By: Duanne Guess D.O.   On: 02/14/2023 12:40   CT ABDOMEN PELVIS W CONTRAST  Result Date: 02/12/2023 CLINICAL DATA:  Abdominal pain, umbilical hernia EXAM: CT ABDOMEN AND PELVIS WITH CONTRAST TECHNIQUE: Multidetector CT imaging of the abdomen and pelvis was performed using the standard protocol following bolus administration of intravenous contrast. RADIATION DOSE REDUCTION: This exam was performed according to the departmental dose-optimization program which includes automated exposure control, adjustment of the mA and/or kV according to patient size and/or use of iterative reconstruction technique. CONTRAST:  OMNIPAQUE IOHEXOL 300 MG/ML  SOLN COMPARISON:  CT abdomen pelvis, 02/22/2022 FINDINGS: Lower chest: No acute abnormality. Three-vessel coronary artery calcifications. Dependent bibasilar scarring or atelectasis. Hepatobiliary: No solid liver abnormality is  seen. Multiple rim calcified gallstones. Unchanged intra and extrahepatic biliary ductal dilatation. The central common bile duct measures up to 0.9 cm in caliber without calculus or other obstruction identified to the ampulla. Pancreas: Unremarkable. No pancreatic ductal dilatation or surrounding inflammatory changes. Spleen: Normal in size without significant abnormality. Adrenals/Urinary Tract: Adrenal glands are unremarkable. Kidneys are normal, without renal calculi, solid lesion, or hydronephrosis. Bladder is unremarkable. Stomach/Bowel: Stomach is within normal limits. Appendix is not clearly visualized. Single loop of mid small bowel within umbilical  hernia. The small bowel proximal to the hernia is fluid-filled although not overtly distended, measuring up to 3.0 cm in caliber. Pancolonic diverticulosis, severe in the sigmoid. Vascular/Lymphatic: Aortic atherosclerosis. No enlarged abdominal or pelvic lymph nodes. Reproductive: No mass or other significant abnormality. Other: Umbilical hernia containing fluid and a single loop of mid small bowel, hernia sac measuring 4.6 x 2.9 cm, hernia neck 1.5 cm (series 2, image 45). Small volume ascites. Musculoskeletal: No acute or significant osseous findings. IMPRESSION: 1. Umbilical hernia containing fluid and a single loop of mid small bowel, hernia sac measuring 4.6 x 2.9 cm, hernia neck 1.5 cm. 2. The small bowel proximal to the hernia is fluid-filled although not overtly distended, measuring up to 3.0 cm in caliber, concerning for partial or developing small bowel obstruction. 3. Small volume ascites. 4. Pancolonic diverticulosis, severe in the sigmoid. 5. Cholelithiasis. 6. Unchanged intra and extrahepatic biliary ductal dilatation. The central common bile duct measures up to 0.9 cm in caliber without calculus or other obstruction identified to the ampulla. 7. Coronary artery disease. Aortic Atherosclerosis (ICD10-I70.0). Electronically Signed   By: Jearld Lesch M.D.   On: 02/12/2023 17:08    Procedures Procedures    Medications Ordered in ED Medications  sodium chloride 0.9 % bolus 500 mL (0 mLs Intravenous Stopped 02/14/23 1053)  iohexol (OMNIPAQUE) 300 MG/ML solution 100 mL (100 mLs Intravenous Contrast Given 02/14/23 0959)  potassium chloride SA (KLOR-CON M) CR tablet 40 mEq (40 mEq Oral Given 02/14/23 1412)    ED Course/ Medical Decision Making/ A&P                             Medical Decision Making Amount and/or Complexity of Data Reviewed Labs: ordered.    Details: Hemoglobin uptrending from a few days ago, up to 8.7 Hypokalemia to 3.0  Radiology: ordered and independent interpretation  performed.    Details: No incarcerated hernia  Risk Prescription drug management.   Hemoglobin is uptrending from the other day.  White blood cell count is normal, lactate is normal.  Mild hypokalemia but will replete this and she is supposed to be on it at home.  Has not eaten since yesterday afternoon.  CT does not show an incarcerated hernia.  There is a small amount of free fluid though on my review this was there a few days ago as well.  At this point, the patient is feeling fine and wants to go home.  Vital signs are normal.  Advise she should follow-up with outpatient general surgery.        Final Clinical Impression(s) / ED Diagnoses Final diagnoses:  Periumbilical abdominal pain  Periumbilical hernia  Hypokalemia    Rx / DC Orders ED Discharge Orders          Ordered    potassium chloride SA (KLOR-CON M) 20 MEQ tablet  Daily        02/14/23 1346  Pricilla Loveless, MD 02/14/23 [phone]

## 2023-02-14 NOTE — ED Notes (Signed)
Placed ice pack on hernia

## 2023-02-14 NOTE — ED Notes (Signed)
Changed and cleaned pt's bed sheets. Cleaned pt and changed brief.

## 2023-02-14 NOTE — ED Triage Notes (Signed)
Pt arrived by [facility] from home with c/o pain around umbilical hernia that started last night. Pt reports this happened a few days ago and the doctor was able to reduce it without any issues and she went back home.

## 2023-02-16 ENCOUNTER — Encounter: Payer: Self-pay | Admitting: General Surgery

## 2023-02-16 ENCOUNTER — Ambulatory Visit (INDEPENDENT_AMBULATORY_CARE_PROVIDER_SITE_OTHER): Payer: PPO | Admitting: General Surgery

## 2023-02-16 VITALS — BP 136/65 | HR 79 | Temp 98.5°F | Resp 12 | Ht 66.0 in | Wt 127.0 lb

## 2023-02-16 DIAGNOSIS — K429 Umbilical hernia without obstruction or gangrene: Secondary | ICD-10-CM

## 2023-02-16 NOTE — Progress Notes (Signed)
Whitney Watts; [id number]; 15-Mar-1923   HPI Patient is a 89 year old white female who was referred to my care by Dr. Nita Sells for evaluation and treatment of an umbilical hernia.  Patient states she has had intermittent episodes of periumbilical pain and swelling in the past.  She was recently seen in the emergency room for an incarcerated umbilical hernia which was reduced.  She lives at home and is primarily self-sufficient.  She currently has no abdominal pain. Past Medical History:  Diagnosis Date   DM (diabetes mellitus) (HCC)    HTN (hypertension)     Past Surgical History:  Procedure Laterality Date   ABDOMINAL HYSTERECTOMY     APPENDECTOMY     LUMBAR DISC SURGERY      Family History  Problem Relation Age of Onset   Diabetes Other    Cancer Other    Arthritis Other     Current Outpatient Medications on File Prior to Visit  Medication Sig Dispense Refill   acetaminophen (TYLENOL) 500 MG tablet Take 500 mg by mouth every 6 (six) hours as needed for mild pain.     amLODipine (NORVASC) 10 MG tablet Take 10 mg by mouth daily.     docusate sodium (COLACE) 100 MG capsule Take 1 capsule (100 mg total) by mouth 2 (two) times daily. 60 capsule 2   metFORMIN (GLUCOPHAGE-XR) 500 MG 24 hr tablet Take 1,000 mg by mouth 2 (two) times daily.     pantoprazole (PROTONIX) 40 MG tablet Take 1 tablet (40 mg total) by mouth daily. 30 tablet 0   potassium chloride SA (KLOR-CON M) 20 MEQ tablet Take 1 tablet (20 mEq total) by mouth daily. 3 tablet 0   traMADol (ULTRAM) 50 MG tablet TAKE ONE TABLET BY MOUTH EVERY 6 HOURS AS NEEDED (Patient taking differently: Take 50 mg by mouth every 6 (six) hours as needed for moderate pain.) 30 tablet 0   No current facility-administered medications on file prior to visit.    Allergies  Allergen Reactions   Bee Venom Swelling and Rash   Pravastatin Sodium Other (See Comments)    REACTION: Leg weakness   Rosuvastatin Other (See Comments)    REACTION: Body  aches    Social History   Substance and Sexual Activity  Alcohol Use No    Social History   Tobacco Use  Smoking Status Never  Smokeless Tobacco Never    Review of Systems  Constitutional: Negative.   HENT: Negative.    Eyes: Negative.   Respiratory: Negative.    Cardiovascular: Negative.   Gastrointestinal:  Positive for abdominal pain.  Musculoskeletal:  Positive for back pain, joint pain and neck pain.  Skin: Negative.   Neurological: Negative.   Endo/Heme/Allergies: Negative.     Objective   Vitals:   02/16/23 1055  BP: 136/65  Pulse: 79  Resp: 12  Temp: 98.5 F (36.9 C)  SpO2: 96%    Physical Exam Vitals reviewed.  Constitutional:      Appearance: Normal appearance. She is normal weight. She is not ill-appearing.     Comments: Kyphotic white female who ambulates with a walker.  HENT:     Head: Normocephalic and atraumatic.  Cardiovascular:     Rate and Rhythm: Normal rate and regular rhythm.     Heart sounds: Normal heart sounds. No murmur heard.    No friction rub. No gallop.  Pulmonary:     Effort: Pulmonary effort is normal. No respiratory distress.     Breath  sounds: Normal breath sounds. No stridor. No wheezing, rhonchi or rales.  Abdominal:     General: Abdomen is flat. Bowel sounds are normal. There is no distension.     Palpations: Abdomen is soft. There is no mass.     Tenderness: There is no abdominal tenderness. There is no guarding or rebound.     Hernia: A hernia is present.     Comments: 3 to 4 cm reducible umbilical hernia present.  Skin:    General: Skin is warm and dry.  Neurological:     Mental Status: She is alert and oriented to person, place, and time.    ER notes reviewed CT scan images personally reviewed Assessment  Umbilical hernia Plan  I told the patient and family member that I do not recommend surgical intervention at the present time given her age and overall medical condition.  Instructions were given as to  how to reduce the hernia at home without requiring an emergency room visit.  The patient and family agree.  Follow-up expectantly.

## 2023-03-03 ENCOUNTER — Other Ambulatory Visit (HOSPITAL_COMMUNITY): Payer: Self-pay | Admitting: Emergency Medicine

## 2023-03-03 ENCOUNTER — Other Ambulatory Visit: Payer: Self-pay | Admitting: Emergency Medicine

## 2023-03-03 DIAGNOSIS — D500 Iron deficiency anemia secondary to blood loss (chronic): Secondary | ICD-10-CM

## 2023-03-03 DIAGNOSIS — D509 Iron deficiency anemia, unspecified: Secondary | ICD-10-CM | POA: Insufficient documentation

## 2023-03-10 ENCOUNTER — Telehealth: Payer: Self-pay | Admitting: Pharmacy Technician

## 2023-03-10 NOTE — Telephone Encounter (Signed)
[facility] note  Patient will be scheduled as soon as possible  Auth Submission: NO AUTH NEEDED Site of care: Site of care: [facility] Payer: [employer] Medication & CPT/J Code(s) submitted: Feraheme (ferumoxytol) [id number] Route of submission (phone, fax, portal):  Phone # Fax # Auth type: Buy/Bill Units/visits requested: 2 Reference number: [id number] 03/10/23 10:43a Approval from: 03/10/23 to 07/11/23

## 2023-03-17 ENCOUNTER — Encounter (HOSPITAL_COMMUNITY)
Admission: RE | Admit: 2023-03-17 | Discharge: 2023-03-17 | Disposition: A | Discharge status: Home / Self Care | Payer: PPO | Source: Ambulatory Visit | Attending: Internal Medicine | Admitting: Internal Medicine

## 2023-03-17 VITALS — BP 129/69 | HR 82 | Temp 98.4°F | Resp 18

## 2023-03-17 DIAGNOSIS — Z01818 Encounter for other preprocedural examination: Secondary | ICD-10-CM | POA: Diagnosis not present

## 2023-03-17 DIAGNOSIS — D500 Iron deficiency anemia secondary to blood loss (chronic): Secondary | ICD-10-CM

## 2023-03-17 MED ORDER — ACETAMINOPHEN 325 MG PO TABS
650.0000 mg | ORAL_TABLET | Freq: Once | ORAL | Status: AC
  Administered 2023-03-17: 650 mg via ORAL
  Filled 2023-03-17: qty 2

## 2023-03-17 MED ORDER — DIPHENHYDRAMINE HCL 25 MG PO CAPS
25.0000 mg | ORAL_CAPSULE | Freq: Once | ORAL | Status: AC
  Administered 2023-03-17: 25 mg via ORAL
  Filled 2023-03-17: qty 1

## 2023-03-17 MED ORDER — SODIUM CHLORIDE 0.9 % IV SOLN
510.0000 mg | Freq: Once | INTRAVENOUS | Status: AC
  Administered 2023-03-17: 510 mg via INTRAVENOUS
  Filled 2023-03-17: qty 510

## 2023-03-17 NOTE — Progress Notes (Signed)
Diagnosis: Iron Deficiency Anemia  Provider:  Dwana Melena MD  Procedure: IV Infusion  IV Type: Peripheral, IV Location: L Antecubital  Feraheme (Ferumoxytol), Dose: 510 mg  Infusion Start Time: 1045  Infusion Stop Time: 1100  Post Infusion IV Care: Observation period completed  Discharge: Condition: Good, Destination: Home . AVS Provided  Performed by:  Daleen Squibb, RN

## 2023-03-17 NOTE — Addendum Note (Signed)
Encounter addended by: Arrie Senate, RN on: 03/17/2023 1:46 PM  Actions taken: Therapy plan modified

## 2023-03-24 ENCOUNTER — Encounter (HOSPITAL_COMMUNITY)
Admission: RE | Admit: 2023-03-24 | Discharge: 2023-03-24 | Disposition: A | Discharge status: Home / Self Care | Payer: PPO | Source: Ambulatory Visit | Attending: Internal Medicine | Admitting: Internal Medicine

## 2023-03-24 NOTE — Progress Notes (Signed)
Contacted patients daughter Johnny Bridge and informed her that computers were down and we would call next week to get her mother rescheduled.

## 2023-04-05 ENCOUNTER — Telehealth: Payer: Self-pay | Admitting: Pharmacy Technician

## 2023-04-05 NOTE — Telephone Encounter (Signed)
Auth Submission: NO AUTH NEEDED Site of care: Site of care: [facility] Payer: [employer] P[employer] Medication & CPT/J Code(s) submitted: Feraheme (ferumoxytol) [id number] Route of submission (phone, fax, portal):  Phone # Fax # Auth type: Buy/Bill PB Units/visits requested: 1 dose Reference number: [id number] Approval from: 04/05/2023 to 11/31/2024

## 2023-04-07 ENCOUNTER — Encounter (HOSPITAL_COMMUNITY): Payer: PPO | Attending: Internal Medicine | Admitting: *Deleted

## 2023-04-07 ENCOUNTER — Encounter (HOSPITAL_COMMUNITY): Admission: RE | Admit: 2023-04-07 | Payer: PPO | Source: Ambulatory Visit

## 2023-04-07 VITALS — BP 137/74 | HR 70 | Temp 97.5°F | Resp 18

## 2023-04-07 DIAGNOSIS — D509 Iron deficiency anemia, unspecified: Secondary | ICD-10-CM

## 2023-04-07 DIAGNOSIS — D500 Iron deficiency anemia secondary to blood loss (chronic): Secondary | ICD-10-CM | POA: Insufficient documentation

## 2023-04-07 MED ORDER — SODIUM CHLORIDE 0.9 % IV SOLN
510.0000 mg | Freq: Once | INTRAVENOUS | Status: AC
  Administered 2023-04-07: 510 mg via INTRAVENOUS
  Filled 2023-04-07: qty 17

## 2023-04-07 MED ORDER — DIPHENHYDRAMINE HCL 25 MG PO CAPS
25.0000 mg | ORAL_CAPSULE | Freq: Once | ORAL | Status: AC
  Administered 2023-04-07: 25 mg via ORAL

## 2023-04-07 MED ORDER — ACETAMINOPHEN 325 MG PO TABS
650.0000 mg | ORAL_TABLET | Freq: Once | ORAL | Status: AC
  Administered 2023-04-07: 650 mg via ORAL

## 2023-04-07 NOTE — Progress Notes (Signed)
Diagnosis: Iron Deficiency Anemia  Provider:  Dwana Melena MD  Procedure: IV Infusion  IV Type: Peripheral, IV Location: L Antecubital  Feraheme (Ferumoxytol), Dose: 510 mg  Infusion Start Time: 1034  Infusion Stop Time: 1055  Post Infusion IV Care: Observation period completed  Discharge: Condition: Good, Destination: Home . AVS Provided  Performed by:  Daleen Squibb, RN

## 2023-04-10 ENCOUNTER — Ambulatory Visit (INDEPENDENT_AMBULATORY_CARE_PROVIDER_SITE_OTHER): Payer: PPO | Admitting: Gastroenterology

## 2023-04-10 NOTE — Progress Notes (Signed)
Diagnosis: Iron Deficiency Anemia  Provider:  Dwana Melena MD  Procedure: IV Infusion  IV Type: Peripheral, IV Location: L Antecubital  Feraheme (Ferumoxytol), Dose: 510 mg  Infusion Start Time: 1034  Infusion Stop Time: 1055  Post Infusion IV Care: Observation period completed  Discharge: Condition: Good, Destination: Home . AVS Provided  Performed by:  Marin Shutter, RN

## 2023-04-19 ENCOUNTER — Ambulatory Visit (INDEPENDENT_AMBULATORY_CARE_PROVIDER_SITE_OTHER): Payer: PPO | Admitting: Orthopedic Surgery

## 2023-04-19 ENCOUNTER — Encounter: Payer: Self-pay | Admitting: Orthopedic Surgery

## 2023-04-19 DIAGNOSIS — M1711 Unilateral primary osteoarthritis, right knee: Secondary | ICD-10-CM | POA: Diagnosis not present

## 2023-04-19 DIAGNOSIS — M19011 Primary osteoarthritis, right shoulder: Secondary | ICD-10-CM | POA: Diagnosis not present

## 2023-04-19 NOTE — Progress Notes (Signed)
Orthopaedic Clinic Return  Assessment: Whitney Watts is a 89 y.o. female with the following: Right shoulder pain, pseudoparalysis Right knee arthritis.  Plan: Whitney Watts continues to have pain in her right shoulder, and her right knee.  She continues to have excellent responses with injections.  She would like injections in both the shoulder and the knee.  Right knee and shoulder injections completed in clinic today without issues.   Procedure note injection - Right shoulder    Verbal consent was obtained to inject the right shoulder, subacromial space Timeout was completed to confirm the site of injection.   The skin was prepped with alcohol and ethyl chloride was sprayed at the injection site.  A 21-gauge needle was used to inject 40 mg of Depo-Medrol and 1% lidocaine (3 cc) into the subacromial space of the right shoulder using a posterolateral approach.  There were no complications.  A sterile bandage was applied.   Procedure note injection Right knee joint   Verbal consent was obtained to inject the right knee joint  Timeout was completed to confirm the site of injection.  The skin was prepped with alcohol and ethyl chloride was sprayed at the injection site.  A 21-gauge needle was used to inject 40 mg of Depo-Medrol and 1% lidocaine (3 cc) into the right knee using an anterolateral approach.  There were no complications. A sterile bandage was applied.     Follow-up: Return if symptoms worsen or fail to improve.   Subjective:  Chief Complaint  Patient presents with   Injections    R shoulder and knee   NDC: [phone]    History of Present Illness: Whitney Watts is a 89 y.o. female who returns to clinic for repeat evaluation of her right shoulder and right knee.  Most recent injections were approximately 3 months ago.  She continues to have good relief following injections.  Pain starts to return within the past 2-3 weeks.  She would like to continue with  injections today.   Review of Systems: No fevers or chills No numbness or tingling No chest pain No shortness of breath No bowel or bladder dysfunction No GI distress No headaches   Objective: There were no vitals taken for this visit.  Physical Exam:  Elderly female.  No acute distress.  Alert and oriented.  Seated in a wheelchair  Right shoulder with some swelling anterior.  Mild tenderness to palpation.  Actively, she is unable to get her arm to 90 degrees of forward flexion.  Abduction at her side is limited to 90 degrees.  Fingers are warm and well-perfused.  Right knee with valgus alignment.  Tenderness palpation over the lateral knee.  She has good range of motion.   IMAGING: I personally ordered and reviewed the following images:  No imaging obtained today.  Oliver Barre, MD 04/19/2023 10:17 AM

## 2023-04-19 NOTE — Patient Instructions (Addendum)
Instructions Following Joint Injections  In clinic today, you received an injection in one of your joints (sometimes more than one).  Occasionally, you can have some pain at the injection site, this is normal.  You can place ice at the injection site, or take over-the-counter medications such as Tylenol (acetaminophen) or Advil (ibuprofen).  Please follow all directions listed on the bottle.  If your joint (knee or shoulder) becomes swollen, red or very painful, please contact the clinic for additional assistance.   Two medications were injected, including lidocaine and a steroid (often referred to as cortisone).  Lidocaine is effective almost immediately but wears off quickly.  However, the steroid can take a few days to improve your symptoms.  In some cases, it can make your pain worse for a couple of days.  Do not be concerned if this happens as it is common.  You can apply ice or take some over-the-counter medications as needed.    Can repeat in 3 months, or as needed

## 2023-05-10 DIAGNOSIS — D509 Iron deficiency anemia, unspecified: Secondary | ICD-10-CM | POA: Diagnosis not present

## 2023-05-10 DIAGNOSIS — M179 Osteoarthritis of knee, unspecified: Secondary | ICD-10-CM | POA: Diagnosis not present

## 2023-05-10 DIAGNOSIS — Z23 Encounter for immunization: Secondary | ICD-10-CM | POA: Diagnosis not present

## 2023-05-10 DIAGNOSIS — M545 Low back pain, unspecified: Secondary | ICD-10-CM | POA: Diagnosis not present

## 2023-05-10 DIAGNOSIS — E782 Mixed hyperlipidemia: Secondary | ICD-10-CM | POA: Diagnosis not present

## 2023-05-10 DIAGNOSIS — I10 Essential (primary) hypertension: Secondary | ICD-10-CM | POA: Diagnosis not present

## 2023-05-10 DIAGNOSIS — I700 Atherosclerosis of aorta: Secondary | ICD-10-CM | POA: Diagnosis not present

## 2023-05-10 DIAGNOSIS — R269 Unspecified abnormalities of gait and mobility: Secondary | ICD-10-CM | POA: Diagnosis not present

## 2023-05-10 DIAGNOSIS — E1169 Type 2 diabetes mellitus with other specified complication: Secondary | ICD-10-CM | POA: Diagnosis not present

## 2023-05-10 DIAGNOSIS — K460 Unspecified abdominal hernia with obstruction, without gangrene: Secondary | ICD-10-CM | POA: Diagnosis not present

## 2023-05-10 DIAGNOSIS — E118 Type 2 diabetes mellitus with unspecified complications: Secondary | ICD-10-CM | POA: Diagnosis not present

## 2023-05-10 DIAGNOSIS — R600 Localized edema: Secondary | ICD-10-CM | POA: Diagnosis not present

## 2023-05-10 DIAGNOSIS — Z111 Encounter for screening for respiratory tuberculosis: Secondary | ICD-10-CM | POA: Diagnosis not present

## 2023-05-11 DIAGNOSIS — E1169 Type 2 diabetes mellitus with other specified complication: Secondary | ICD-10-CM | POA: Diagnosis not present

## 2023-05-22 DIAGNOSIS — E1151 Type 2 diabetes mellitus with diabetic peripheral angiopathy without gangrene: Secondary | ICD-10-CM | POA: Diagnosis not present

## 2023-05-22 DIAGNOSIS — B351 Tinea unguium: Secondary | ICD-10-CM | POA: Diagnosis not present

## 2023-06-26 DIAGNOSIS — G894 Chronic pain syndrome: Secondary | ICD-10-CM | POA: Diagnosis not present

## 2023-06-26 DIAGNOSIS — M159 Polyosteoarthritis, unspecified: Secondary | ICD-10-CM | POA: Diagnosis not present

## 2023-06-26 DIAGNOSIS — E119 Type 2 diabetes mellitus without complications: Secondary | ICD-10-CM | POA: Diagnosis not present

## 2023-06-26 DIAGNOSIS — K219 Gastro-esophageal reflux disease without esophagitis: Secondary | ICD-10-CM | POA: Diagnosis not present

## 2023-06-26 DIAGNOSIS — K5900 Constipation, unspecified: Secondary | ICD-10-CM | POA: Diagnosis not present

## 2023-06-26 DIAGNOSIS — D509 Iron deficiency anemia, unspecified: Secondary | ICD-10-CM | POA: Diagnosis not present

## 2023-06-26 DIAGNOSIS — I119 Hypertensive heart disease without heart failure: Secondary | ICD-10-CM | POA: Diagnosis not present

## 2023-06-26 DIAGNOSIS — E782 Mixed hyperlipidemia: Secondary | ICD-10-CM | POA: Diagnosis not present

## 2023-07-03 DIAGNOSIS — G894 Chronic pain syndrome: Secondary | ICD-10-CM | POA: Diagnosis not present

## 2023-07-03 DIAGNOSIS — Z9181 History of falling: Secondary | ICD-10-CM | POA: Diagnosis not present

## 2023-07-07 DIAGNOSIS — I10 Essential (primary) hypertension: Secondary | ICD-10-CM | POA: Diagnosis not present

## 2023-07-07 DIAGNOSIS — E559 Vitamin D deficiency, unspecified: Secondary | ICD-10-CM | POA: Diagnosis not present

## 2023-07-07 DIAGNOSIS — E039 Hypothyroidism, unspecified: Secondary | ICD-10-CM | POA: Diagnosis not present

## 2023-07-07 DIAGNOSIS — E782 Mixed hyperlipidemia: Secondary | ICD-10-CM | POA: Diagnosis not present

## 2023-07-10 DIAGNOSIS — E559 Vitamin D deficiency, unspecified: Secondary | ICD-10-CM | POA: Diagnosis not present

## 2023-07-10 DIAGNOSIS — D509 Iron deficiency anemia, unspecified: Secondary | ICD-10-CM | POA: Diagnosis not present

## 2023-07-19 DIAGNOSIS — M170 Bilateral primary osteoarthritis of knee: Secondary | ICD-10-CM | POA: Diagnosis not present

## 2023-07-24 DIAGNOSIS — E782 Mixed hyperlipidemia: Secondary | ICD-10-CM | POA: Diagnosis not present

## 2023-07-24 DIAGNOSIS — M179 Osteoarthritis of knee, unspecified: Secondary | ICD-10-CM | POA: Diagnosis not present

## 2023-07-24 DIAGNOSIS — I119 Hypertensive heart disease without heart failure: Secondary | ICD-10-CM | POA: Diagnosis not present

## 2023-07-25 ENCOUNTER — Ambulatory Visit (INDEPENDENT_AMBULATORY_CARE_PROVIDER_SITE_OTHER): Payer: PPO | Admitting: Orthopedic Surgery

## 2023-07-25 ENCOUNTER — Encounter: Payer: Self-pay | Admitting: Orthopedic Surgery

## 2023-07-25 DIAGNOSIS — M1711 Unilateral primary osteoarthritis, right knee: Secondary | ICD-10-CM

## 2023-07-25 DIAGNOSIS — M19011 Primary osteoarthritis, right shoulder: Secondary | ICD-10-CM | POA: Diagnosis not present

## 2023-07-25 NOTE — Progress Notes (Signed)
Orthopaedic Clinic Return  Assessment: Whitney Watts is a 89 y.o. female with the following: Right shoulder pain, pseudoparalysis Right knee arthritis.  Plan: Whitney Watts has recurrence of pain in the right shoulder, and the right knee.  Prior injections have provided improvements in her symptoms for at least 3 months.  She would like to proceed with repeat injections today.   Procedure note injection - Right shoulder    Verbal consent was obtained to inject the right shoulder, subacromial space Timeout was completed to confirm the site of injection.   The skin was prepped with alcohol and ethyl chloride was sprayed at the injection site.  A 21-gauge needle was used to inject 40 mg of Depo-Medrol and 1% lidocaine (3 cc) into the subacromial space of the right shoulder using a posterolateral approach.  There were no complications.  A sterile bandage was applied.   Procedure note injection Right knee joint   Verbal consent was obtained to inject the right knee joint  Timeout was completed to confirm the site of injection.  The skin was prepped with alcohol and ethyl chloride was sprayed at the injection site.  A 21-gauge needle was used to inject 40 mg of Depo-Medrol and 1% lidocaine (3 cc) into the right knee using an anterolateral approach.  There were no complications. A sterile bandage was applied.     Follow-up: Return if symptoms worsen or fail to improve.   Subjective:  Chief Complaint  Patient presents with   Injections    R shoulder and knee    History of Present Illness: Whitney Watts is a 89 y.o. female who returns to clinic for repeat evaluation of her right shoulder and right knee.  She continues to have excellent relief of her symptoms, which tend to last about 3 months.  Prior injections have worn off.  She continues to have pain.  She is not interested in repeat injections.   Review of Systems: No fevers or chills No numbness or tingling No chest  pain No shortness of breath No bowel or bladder dysfunction No GI distress No headaches   Objective: There were no vitals taken for this visit.  Physical Exam:  Elderly female.  No acute distress.  Alert and oriented.  Seated in a wheelchair  Right shoulder with some swelling anterior.  Mild tenderness to palpation.  Actively, she is unable to get her arm to 90 degrees of forward flexion.  Abduction at her side is limited to 90 degrees.  Fingers are warm and well-perfused.  Right knee with valgus alignment.  Tenderness palpation over the lateral knee.  She has good range of motion.   IMAGING: I personally ordered and reviewed the following images:  No imaging obtained today.  Oliver Barre, MD 07/25/2023 11:17 PM

## 2023-07-25 NOTE — Patient Instructions (Signed)

## 2023-07-31 DIAGNOSIS — G894 Chronic pain syndrome: Secondary | ICD-10-CM | POA: Diagnosis not present

## 2023-08-02 DIAGNOSIS — N390 Urinary tract infection, site not specified: Secondary | ICD-10-CM | POA: Diagnosis not present

## 2023-08-07 DIAGNOSIS — N390 Urinary tract infection, site not specified: Secondary | ICD-10-CM | POA: Diagnosis not present

## 2023-08-07 DIAGNOSIS — G894 Chronic pain syndrome: Secondary | ICD-10-CM | POA: Diagnosis not present

## 2023-08-15 DIAGNOSIS — K219 Gastro-esophageal reflux disease without esophagitis: Secondary | ICD-10-CM | POA: Diagnosis not present

## 2023-08-15 DIAGNOSIS — E119 Type 2 diabetes mellitus without complications: Secondary | ICD-10-CM | POA: Diagnosis not present

## 2023-08-15 DIAGNOSIS — N390 Urinary tract infection, site not specified: Secondary | ICD-10-CM | POA: Diagnosis not present

## 2023-08-28 DIAGNOSIS — M6281 Muscle weakness (generalized): Secondary | ICD-10-CM | POA: Diagnosis not present

## 2023-08-28 DIAGNOSIS — G894 Chronic pain syndrome: Secondary | ICD-10-CM | POA: Diagnosis not present

## 2023-09-01 DIAGNOSIS — M179 Osteoarthritis of knee, unspecified: Secondary | ICD-10-CM | POA: Diagnosis not present

## 2023-09-01 DIAGNOSIS — E782 Mixed hyperlipidemia: Secondary | ICD-10-CM | POA: Diagnosis not present

## 2023-09-01 DIAGNOSIS — I10 Essential (primary) hypertension: Secondary | ICD-10-CM | POA: Diagnosis not present

## 2023-09-11 DIAGNOSIS — E782 Mixed hyperlipidemia: Secondary | ICD-10-CM | POA: Diagnosis not present

## 2023-09-11 DIAGNOSIS — I119 Hypertensive heart disease without heart failure: Secondary | ICD-10-CM | POA: Diagnosis not present

## 2023-09-11 DIAGNOSIS — G894 Chronic pain syndrome: Secondary | ICD-10-CM | POA: Diagnosis not present

## 2023-10-02 DIAGNOSIS — G894 Chronic pain syndrome: Secondary | ICD-10-CM | POA: Diagnosis not present

## 2023-10-02 DIAGNOSIS — K219 Gastro-esophageal reflux disease without esophagitis: Secondary | ICD-10-CM | POA: Diagnosis not present

## 2023-10-02 DIAGNOSIS — M6281 Muscle weakness (generalized): Secondary | ICD-10-CM | POA: Diagnosis not present

## 2023-10-13 DIAGNOSIS — M179 Osteoarthritis of knee, unspecified: Secondary | ICD-10-CM | POA: Diagnosis not present

## 2023-10-13 DIAGNOSIS — I10 Essential (primary) hypertension: Secondary | ICD-10-CM | POA: Diagnosis not present

## 2023-10-13 DIAGNOSIS — E782 Mixed hyperlipidemia: Secondary | ICD-10-CM | POA: Diagnosis not present

## 2023-10-16 DIAGNOSIS — E119 Type 2 diabetes mellitus without complications: Secondary | ICD-10-CM | POA: Diagnosis not present

## 2023-10-16 DIAGNOSIS — D509 Iron deficiency anemia, unspecified: Secondary | ICD-10-CM | POA: Diagnosis not present

## 2023-10-16 DIAGNOSIS — M6281 Muscle weakness (generalized): Secondary | ICD-10-CM | POA: Diagnosis not present

## 2023-10-30 DIAGNOSIS — G894 Chronic pain syndrome: Secondary | ICD-10-CM | POA: Diagnosis not present

## 2023-11-01 ENCOUNTER — Other Ambulatory Visit: Payer: Self-pay

## 2023-11-01 ENCOUNTER — Emergency Department (HOSPITAL_COMMUNITY)
Admission: EM | Admit: 2023-11-01 | Discharge: 2023-11-01 | Disposition: A | Discharge status: Home / Self Care | Payer: PPO | Attending: Student | Admitting: Student

## 2023-11-01 ENCOUNTER — Emergency Department (HOSPITAL_COMMUNITY): Payer: PPO

## 2023-11-01 ENCOUNTER — Encounter (HOSPITAL_COMMUNITY): Payer: Self-pay | Admitting: *Deleted

## 2023-11-01 DIAGNOSIS — W19XXXA Unspecified fall, initial encounter: Secondary | ICD-10-CM | POA: Diagnosis not present

## 2023-11-01 DIAGNOSIS — I10 Essential (primary) hypertension: Secondary | ICD-10-CM | POA: Diagnosis not present

## 2023-11-01 DIAGNOSIS — E119 Type 2 diabetes mellitus without complications: Secondary | ICD-10-CM | POA: Diagnosis not present

## 2023-11-01 DIAGNOSIS — Y92002 Bathroom of unspecified non-institutional (private) residence single-family (private) house as the place of occurrence of the external cause: Secondary | ICD-10-CM | POA: Diagnosis not present

## 2023-11-01 DIAGNOSIS — S199XXA Unspecified injury of neck, initial encounter: Secondary | ICD-10-CM | POA: Diagnosis not present

## 2023-11-01 DIAGNOSIS — S0990XA Unspecified injury of head, initial encounter: Secondary | ICD-10-CM | POA: Diagnosis not present

## 2023-11-01 DIAGNOSIS — S0993XA Unspecified injury of face, initial encounter: Secondary | ICD-10-CM | POA: Diagnosis not present

## 2023-11-01 DIAGNOSIS — W1811XA Fall from or off toilet without subsequent striking against object, initial encounter: Secondary | ICD-10-CM | POA: Diagnosis not present

## 2023-11-01 DIAGNOSIS — Z043 Encounter for examination and observation following other accident: Secondary | ICD-10-CM | POA: Diagnosis not present

## 2023-11-01 DIAGNOSIS — S0181XA Laceration without foreign body of other part of head, initial encounter: Secondary | ICD-10-CM | POA: Insufficient documentation

## 2023-11-01 DIAGNOSIS — Z7984 Long term (current) use of oral hypoglycemic drugs: Secondary | ICD-10-CM | POA: Insufficient documentation

## 2023-11-01 DIAGNOSIS — M25519 Pain in unspecified shoulder: Secondary | ICD-10-CM | POA: Diagnosis not present

## 2023-11-01 DIAGNOSIS — M19011 Primary osteoarthritis, right shoulder: Secondary | ICD-10-CM | POA: Diagnosis not present

## 2023-11-01 DIAGNOSIS — I700 Atherosclerosis of aorta: Secondary | ICD-10-CM | POA: Diagnosis not present

## 2023-11-01 DIAGNOSIS — I672 Cerebral atherosclerosis: Secondary | ICD-10-CM | POA: Diagnosis not present

## 2023-11-01 DIAGNOSIS — S01112A Laceration without foreign body of left eyelid and periocular area, initial encounter: Secondary | ICD-10-CM | POA: Diagnosis not present

## 2023-11-01 NOTE — ED Triage Notes (Signed)
 Pt BIB [facility] from [facility] for a fall  Pt states she was getting up to go to bathroom and while in bathroom she slipped off toilet landing on her right shoulder and hitting her left eye; pt has laceration to left eye with controlled bleeding

## 2023-11-01 NOTE — ED Notes (Signed)
 Transport via [facility] set up.

## 2023-11-01 NOTE — ED Notes (Signed)
 Patient discharged. Provider spoke to patient. Paperwork given to transport and reviewed with patient. Pt verbalized understanding. VSS. A+Ox4. Patient transported [facility] to [facility] [place] [facility]. No IV in place.

## 2023-11-01 NOTE — ED Notes (Signed)
 Family updated as to patient's status and pending discharge with transport back to [facility] [place]. Ok'd by patient to talk to daughter.

## 2023-11-01 NOTE — ED Notes (Signed)
 [facility] called to transport patient back to the [facility] [place]. Nurse notified.

## 2023-11-02 NOTE — ED Provider Notes (Signed)
 [place] EMERGENCY DEPARTMENT AT [facility] Provider Note  CSN: [id number] Arrival date & time: 11/01/23 1024  Chief Complaint(s) Fall  HPI Whitney Watts is a 89 y.o. female with PMH HTN, T2DM who presents emergency department for evaluation of a fall.  Patient arrives from skilled nursing facility.  States she was getting up to go the bathroom and suffered a mechanical fall off the toilet landing on her right shoulder and striking her face on the ground.  Patient arrives with a small laceration under the left eye.  Currently denies chest pain, shortness of breath, abdominal pain, nausea, vomiting, numbness, tingling, weakness or other systemic, traumatic or neurologic complaints.   Past Medical History Past Medical History:  Diagnosis Date   DM (diabetes mellitus) (HCC)    HTN (hypertension)    Patient Active Problem List   Diagnosis Date Noted   Iron deficiency anemia, unspecified 03/03/2023   Incarcerated hernia 02/22/2022   Hypokalemia 02/22/2022   Hyponatremia 02/22/2022   Generalized abdominal pain    Complication due to secondary diabetes mellitus (HCC) 01/03/2022   Abnormal gait 01/03/2022   Status post thoracentesis    Thyroid nodule    Weakness    Community acquired pneumonia of left lower lobe of lung    Loculated pleural effusion 06/18/2020   Acute respiratory failure with hypoxia (HCC) 06/18/2020   OA (osteoarthritis) of knee 04/17/2013   Right knee pain 04/17/2013   DIABETES MELLITUS, TYPE II, CONTROLLED 03/06/2009   OBESITY 07/18/2008   MYALGIA 04/02/2008   Allergic rhinitis 01/02/2008   ABNORMAL ELECTROCARDIOGRAM 01/03/2007   Controlled type 2 diabetes mellitus without complication, without long-term current use of insulin (HCC) 10/11/2006   HYPERLIPIDEMIA 10/11/2006   DYSMETABOLIC SYNDROME 10/11/2006   POLYCYTHEMIA 10/11/2006   Essential hypertension 10/11/2006   Incisional hernia 10/11/2006   CATARACT NOS 10/02/2006   Constipation  10/02/2006   ABSCESS, BARTHOLIN'S GLAND 10/02/2006   Osteoarthritis 10/02/2006   LOW BACK PAIN 10/02/2006   Home Medication(s) Prior to Admission medications   Medication Sig Start Date End Date Taking? Authorizing Provider  acetaminophen (TYLENOL) 500 MG tablet Take 500 mg by mouth every 6 (six) hours as needed for mild pain.    [provider]  amLODipine (NORVASC) 10 MG tablet Take 10 mg by mouth daily.    [provider]  metFORMIN (GLUCOPHAGE-XR) 500 MG 24 hr tablet Take 1,000 mg by mouth 2 (two) times daily. 04/23/20   [provider]  pantoprazole (PROTONIX) 40 MG tablet Take 1 tablet (40 mg total) by mouth daily. 02/12/23   Carmel Sacramento A, PA-C  potassium chloride SA (KLOR-CON M) 20 MEQ tablet Take 1 tablet (20 mEq total) by mouth daily. 02/14/23   Pricilla Loveless, MD  traMADol (ULTRAM) 50 MG tablet TAKE ONE TABLET BY MOUTH EVERY 6 HOURS AS NEEDED Patient taking differently: Take 50 mg by mouth every 6 (six) hours as needed for moderate pain. 11/10/21   Oliver Barre, MD  Past Surgical History Past Surgical History:  Procedure Laterality Date   ABDOMINAL HYSTERECTOMY     APPENDECTOMY     LUMBAR DISC SURGERY     Family History Family History  Problem Relation Age of Onset   Diabetes Other    Cancer Other    Arthritis Other     Social History Social History   Tobacco Use   Smoking status: Never   Smokeless tobacco: Never  Vaping Use   Vaping status: Never Used  Substance Use Topics   Alcohol use: No   Drug use: No   Allergies Bee venom, Pravastatin sodium, and Rosuvastatin  Review of Systems Review of Systems  Skin:  Positive for wound.    Physical Exam Vital Signs  I have reviewed the triage vital signs BP 133/78   Pulse 87   Temp 98.2 F (36.8 C) (Oral)   Resp 16   SpO2 99%   Physical  Exam Vitals and nursing note reviewed.  Constitutional:      General: She is not in acute distress.    Appearance: She is well-developed.  HENT:     Head: Normocephalic.     Comments: 2 cm facial laceration Eyes:     Conjunctiva/sclera: Conjunctivae normal.  Cardiovascular:     Rate and Rhythm: Normal rate and regular rhythm.     Heart sounds: No murmur heard. Pulmonary:     Effort: Pulmonary effort is normal. No respiratory distress.     Breath sounds: Normal breath sounds.  Abdominal:     Palpations: Abdomen is soft.     Tenderness: There is no abdominal tenderness.  Musculoskeletal:        General: No swelling.     Cervical back: Neck supple.  Skin:    General: Skin is warm and dry.     Capillary Refill: Capillary refill takes less than 2 seconds.  Neurological:     Mental Status: She is alert.  Psychiatric:        Mood and Affect: Mood normal.     ED Results and Treatments Labs (all labs ordered are listed, but only abnormal results are displayed) Labs Reviewed - No data to display                                                                                                                        Radiology No results found.  Pertinent labs & imaging results that were available during my care of the patient were reviewed by me and considered in my medical decision making (see MDM for details).  Medications Ordered in ED Medications - No data to display  Procedures .Laceration Repair  Date/Time: 11/02/2023 11:34 AM  Performed by: Glendora Score, MD Authorized by: Glendora Score, MD   Anesthesia:    Anesthesia method:  Local infiltration   Local anesthetic:  Lidocaine 2% WITH epi Laceration details:    Location:  Face   Facial location: Left orbit.   Length (cm):  2 Treatment:    Area cleansed with:  Soap and water   Amount  of cleaning:  Standard Skin repair:    Repair method:  Tissue adhesive and Steri-Strips   Number of Steri-Strips:  4 Approximation:    Approximation:  Close Repair type:    Repair type:  Simple Post-procedure details:    Dressing:  Open (no dressing)   (including critical care time)  Medical Decision Making / ED Course   This patient presents to the ED for concern of fall, this involves an extensive number of treatment options, and is a complaint that carries with it a high risk of complications and morbidity.  The differential diagnosis includes fracture, contusion, hematoma, ligamentous injury, closed head injury, ICH, laceration, intrathoracic injury, intra-abdominal injury  MDM: Patient seen emergency room for evaluation of a fall.  Physical exam with a 2 cm laceration under the left orbit but is otherwise unremarkable.  No significant tenderness over the chest abdomen or pelvis.  Neurologic exam is unremarkable with no focal motor or sensory deficits.  No cranial nerve deficits.  Trauma imaging including chest x-ray, pelvis x-ray, shoulder x-ray CT head, C-spine, max face with no acute traumatic findings other than a periapical lucency at the right medial maxillary incisor.  No tooth instability on exam.  Facial laceration repaired with Steri-Strips and Dermabond.  At this time with negative trauma workup she does not meet inpatient criteria for admission and will be discharged with outpatient follow-up.  Return precautions given of which she voiced understanding.   Additional history obtained:  -External records from outside source obtained and reviewed including: Chart review including previous notes, labs, imaging, consultation notes   Imaging Studies ordered: I ordered imaging studies including CT head, max face, C-spine, shoulder x-ray, chest x-ray, pelvis x-ray I independently visualized and interpreted imaging. I agree with the radiologist interpretation   Medicines  ordered and prescription drug management: No orders of the defined types were placed in this encounter.   -I have reviewed the patients home medicines and have made adjustments as needed  Critical interventions none   Social Determinants of Health:  Factors impacting patients care include: Lives in skilled nursing facility   Reevaluation: After the interventions noted above, I reevaluated the patient and found that they have :improved  Co morbidities that complicate the patient evaluation  Past Medical History:  Diagnosis Date   DM (diabetes mellitus) (HCC)    HTN (hypertension)       Dispostion: I considered admission for this patient, time she does not meet inpatient criteria for admission and will be discharged with outpatient follow-up     Final Clinical Impression(s) / ED Diagnoses Final diagnoses:  Fall, initial encounter  Facial laceration, initial encounter     @PCDICTATION @    Glendora Score, MD 11/02/23 1137

## 2023-11-13 DIAGNOSIS — E782 Mixed hyperlipidemia: Secondary | ICD-10-CM | POA: Diagnosis not present

## 2023-11-13 DIAGNOSIS — I10 Essential (primary) hypertension: Secondary | ICD-10-CM | POA: Diagnosis not present

## 2023-11-13 DIAGNOSIS — K219 Gastro-esophageal reflux disease without esophagitis: Secondary | ICD-10-CM | POA: Diagnosis not present

## 2023-11-14 ENCOUNTER — Ambulatory Visit (INDEPENDENT_AMBULATORY_CARE_PROVIDER_SITE_OTHER): Payer: PPO | Admitting: Orthopedic Surgery

## 2023-11-14 ENCOUNTER — Encounter: Payer: Self-pay | Admitting: Orthopedic Surgery

## 2023-11-14 DIAGNOSIS — M1711 Unilateral primary osteoarthritis, right knee: Secondary | ICD-10-CM

## 2023-11-14 DIAGNOSIS — M19011 Primary osteoarthritis, right shoulder: Secondary | ICD-10-CM | POA: Diagnosis not present

## 2023-11-14 NOTE — Progress Notes (Signed)
 Orthopaedic Clinic Return  Assessment: Whitney Watts is a 89 y.o. female with the following: Right shoulder pain, pseudoparalysis Right knee arthritis.  Plan: Whitney Watts continues to have pain in the right shoulder, as well as the right knee.  Injections continue to be successful.  She has had return of her symptoms.  She would like repeat injection today.  These are completed without issues today.   Procedure note injection - Right shoulder    Verbal consent was obtained to inject the right shoulder, subacromial space Timeout was completed to confirm the site of injection.   The skin was prepped with alcohol and ethyl chloride was sprayed at the injection site.  A 21-gauge needle was used to inject 40 mg of Depo-Medrol and 1% lidocaine (3 cc) into the subacromial space of the right shoulder using a posterolateral approach.  There were no complications.  A sterile bandage was applied.   Procedure note injection Right knee joint   Verbal consent was obtained to inject the right knee joint  Timeout was completed to confirm the site of injection.  The skin was prepped with alcohol and ethyl chloride was sprayed at the injection site.  A 21-gauge needle was used to inject 40 mg of Depo-Medrol and 1% lidocaine (3 cc) into the right knee using an anterolateral approach.  There were no complications. A sterile bandage was applied.     Follow-up: Return if symptoms worsen or fail to improve.   Subjective:  Chief Complaint  Patient presents with   Follow-up    Recheck on right shoulder and right knee.    History of Present Illness: Whitney Watts is a 89 y.o. female who returns to clinic for repeat evaluation of her right shoulder and right knee.  She has had multiple injections in the past.  Most recent injections were almost 4 months ago.  They remain effective.  She would like to repeat injections today.  Review of Systems: No fevers or chills No numbness or tingling No  chest pain No shortness of breath No bowel or bladder dysfunction No GI distress No headaches   Objective: There were no vitals taken for this visit.  Physical Exam:  Elderly female.  No acute distress.  Alert and oriented.  Seated in a wheelchair  Right shoulder with some swelling anterior.  Mild tenderness to palpation.  Actively, she is unable to get her arm to 90 degrees of forward flexion.  Abduction at her side is limited to 90 degrees.  Fingers are warm and well-perfused.  Right knee with valgus alignment.  Tenderness palpation over the lateral knee.  She has good range of motion.   IMAGING: I personally ordered and reviewed the following images:  No imaging obtained today.  Oliver Barre, MD 11/14/2023 10:21 AM

## 2023-11-14 NOTE — Patient Instructions (Signed)

## 2023-11-20 DIAGNOSIS — R5383 Other fatigue: Secondary | ICD-10-CM | POA: Diagnosis not present

## 2023-11-20 DIAGNOSIS — R4182 Altered mental status, unspecified: Secondary | ICD-10-CM | POA: Diagnosis not present

## 2023-11-21 DIAGNOSIS — N390 Urinary tract infection, site not specified: Secondary | ICD-10-CM | POA: Diagnosis not present

## 2023-11-21 DIAGNOSIS — R4182 Altered mental status, unspecified: Secondary | ICD-10-CM | POA: Diagnosis not present

## 2023-11-24 DIAGNOSIS — E119 Type 2 diabetes mellitus without complications: Secondary | ICD-10-CM | POA: Diagnosis not present

## 2023-11-24 DIAGNOSIS — E782 Mixed hyperlipidemia: Secondary | ICD-10-CM | POA: Diagnosis not present

## 2023-11-27 DIAGNOSIS — N390 Urinary tract infection, site not specified: Secondary | ICD-10-CM | POA: Diagnosis not present

## 2023-11-27 DIAGNOSIS — G894 Chronic pain syndrome: Secondary | ICD-10-CM | POA: Diagnosis not present

## 2023-12-11 DIAGNOSIS — E119 Type 2 diabetes mellitus without complications: Secondary | ICD-10-CM | POA: Diagnosis not present

## 2023-12-11 DIAGNOSIS — M6281 Muscle weakness (generalized): Secondary | ICD-10-CM | POA: Diagnosis not present

## 2023-12-11 DIAGNOSIS — D509 Iron deficiency anemia, unspecified: Secondary | ICD-10-CM | POA: Diagnosis not present

## 2023-12-14 DIAGNOSIS — K219 Gastro-esophageal reflux disease without esophagitis: Secondary | ICD-10-CM | POA: Diagnosis not present

## 2023-12-14 DIAGNOSIS — Z9181 History of falling: Secondary | ICD-10-CM | POA: Diagnosis not present

## 2023-12-14 DIAGNOSIS — I10 Essential (primary) hypertension: Secondary | ICD-10-CM | POA: Diagnosis not present

## 2023-12-14 DIAGNOSIS — D509 Iron deficiency anemia, unspecified: Secondary | ICD-10-CM | POA: Diagnosis not present

## 2023-12-14 DIAGNOSIS — G894 Chronic pain syndrome: Secondary | ICD-10-CM | POA: Diagnosis not present

## 2023-12-14 DIAGNOSIS — E119 Type 2 diabetes mellitus without complications: Secondary | ICD-10-CM | POA: Diagnosis not present

## 2023-12-14 DIAGNOSIS — M159 Polyosteoarthritis, unspecified: Secondary | ICD-10-CM | POA: Diagnosis not present

## 2023-12-14 DIAGNOSIS — E782 Mixed hyperlipidemia: Secondary | ICD-10-CM | POA: Diagnosis not present

## 2023-12-14 DIAGNOSIS — Z556 Problems related to health literacy: Secondary | ICD-10-CM | POA: Diagnosis not present

## 2023-12-14 DIAGNOSIS — M545 Low back pain, unspecified: Secondary | ICD-10-CM | POA: Diagnosis not present

## 2023-12-25 DIAGNOSIS — G894 Chronic pain syndrome: Secondary | ICD-10-CM | POA: Diagnosis not present

## 2023-12-30 ENCOUNTER — Emergency Department (HOSPITAL_COMMUNITY)

## 2023-12-30 ENCOUNTER — Encounter (HOSPITAL_COMMUNITY): Payer: Self-pay

## 2023-12-30 ENCOUNTER — Emergency Department (HOSPITAL_COMMUNITY)
Admission: EM | Admit: 2023-12-30 | Discharge: 2023-12-30 | Disposition: A | Discharge status: Home / Self Care | Attending: Emergency Medicine | Admitting: Emergency Medicine

## 2023-12-30 ENCOUNTER — Other Ambulatory Visit: Payer: Self-pay

## 2023-12-30 DIAGNOSIS — I10 Essential (primary) hypertension: Secondary | ICD-10-CM | POA: Diagnosis not present

## 2023-12-30 DIAGNOSIS — R410 Disorientation, unspecified: Secondary | ICD-10-CM

## 2023-12-30 DIAGNOSIS — Z7401 Bed confinement status: Secondary | ICD-10-CM | POA: Diagnosis not present

## 2023-12-30 DIAGNOSIS — G939 Disorder of brain, unspecified: Secondary | ICD-10-CM | POA: Diagnosis not present

## 2023-12-30 DIAGNOSIS — E119 Type 2 diabetes mellitus without complications: Secondary | ICD-10-CM | POA: Diagnosis not present

## 2023-12-30 DIAGNOSIS — Z7984 Long term (current) use of oral hypoglycemic drugs: Secondary | ICD-10-CM | POA: Insufficient documentation

## 2023-12-30 DIAGNOSIS — I700 Atherosclerosis of aorta: Secondary | ICD-10-CM | POA: Diagnosis not present

## 2023-12-30 DIAGNOSIS — I6782 Cerebral ischemia: Secondary | ICD-10-CM | POA: Diagnosis not present

## 2023-12-30 DIAGNOSIS — R001 Bradycardia, unspecified: Secondary | ICD-10-CM | POA: Diagnosis not present

## 2023-12-30 DIAGNOSIS — G9389 Other specified disorders of brain: Secondary | ICD-10-CM | POA: Diagnosis not present

## 2023-12-30 DIAGNOSIS — G319 Degenerative disease of nervous system, unspecified: Secondary | ICD-10-CM | POA: Diagnosis not present

## 2023-12-30 DIAGNOSIS — R4182 Altered mental status, unspecified: Secondary | ICD-10-CM | POA: Diagnosis present

## 2023-12-30 DIAGNOSIS — R0689 Other abnormalities of breathing: Secondary | ICD-10-CM | POA: Diagnosis not present

## 2023-12-30 LAB — CBC WITH DIFFERENTIAL/PLATELET
Abs Immature Granulocytes: 0.02 10*3/uL (ref 0.00–0.07)
Basophils Absolute: 0.1 10*3/uL (ref 0.0–0.1)
Basophils Relative: 1 %
Eosinophils Absolute: 0.1 10*3/uL (ref 0.0–0.5)
Eosinophils Relative: 1 %
HCT: 32.9 % — ABNORMAL LOW (ref 36.0–46.0)
Hemoglobin: 10.6 g/dL — ABNORMAL LOW (ref 12.0–15.0)
Immature Granulocytes: 0 %
Lymphocytes Relative: 12 %
Lymphs Abs: 0.8 10*3/uL (ref 0.7–4.0)
MCH: 30.5 pg (ref 26.0–34.0)
MCHC: 32.2 g/dL (ref 30.0–36.0)
MCV: 94.8 fL (ref 80.0–100.0)
Monocytes Absolute: 0.5 10*3/uL (ref 0.1–1.0)
Monocytes Relative: 7 %
Neutro Abs: 5 10*3/uL (ref 1.7–7.7)
Neutrophils Relative %: 79 %
Platelets: 306 10*3/uL (ref 150–400)
RBC: 3.47 MIL/uL — ABNORMAL LOW (ref 3.87–5.11)
RDW: 13.1 % (ref 11.5–15.5)
WBC: 6.4 10*3/uL (ref 4.0–10.5)
nRBC: 0 % (ref 0.0–0.2)

## 2023-12-30 LAB — COMPREHENSIVE METABOLIC PANEL WITH GFR
ALT: 12 U/L (ref 0–44)
AST: 13 U/L — ABNORMAL LOW (ref 15–41)
Albumin: 3.1 g/dL — ABNORMAL LOW (ref 3.5–5.0)
Alkaline Phosphatase: 71 U/L (ref 38–126)
Anion gap: 9 (ref 5–15)
BUN: 31 mg/dL — ABNORMAL HIGH (ref 8–23)
CO2: 25 mmol/L (ref 22–32)
Calcium: 9 mg/dL (ref 8.9–10.3)
Chloride: 104 mmol/L (ref 98–111)
Creatinine, Ser: 0.87 mg/dL (ref 0.44–1.00)
GFR, Estimated: 60 mL/min — ABNORMAL LOW (ref >60)
Glucose, Bld: 181 mg/dL — ABNORMAL HIGH (ref 70–99)
Potassium: 3.7 mmol/L (ref 3.5–5.1)
Sodium: 138 mmol/L (ref 135–145)
Total Bilirubin: 0.2 mg/dL (ref 0.0–1.2)
Total Protein: 5.9 g/dL — ABNORMAL LOW (ref 6.5–8.1)

## 2023-12-30 LAB — URINALYSIS, ROUTINE W REFLEX MICROSCOPIC
Bilirubin Urine: NEGATIVE
Glucose, UA: NEGATIVE mg/dL
Hgb urine dipstick: NEGATIVE
Ketones, ur: NEGATIVE mg/dL
Leukocytes,Ua: NEGATIVE
Nitrite: NEGATIVE
Protein, ur: NEGATIVE mg/dL
Specific Gravity, Urine: 1.013 (ref 1.005–1.030)
pH: 6 (ref 5.0–8.0)

## 2023-12-30 LAB — CBG MONITORING, ED: Glucose-Capillary: 203 mg/dL — ABNORMAL HIGH (ref 70–99)

## 2023-12-30 MED ORDER — DEXAMETHASONE SODIUM PHOSPHATE 10 MG/ML IJ SOLN
10.0000 mg | Freq: Once | INTRAMUSCULAR | Status: AC
Start: 2023-12-30 — End: 2023-12-30
  Administered 2023-12-30: 10 mg via INTRAVENOUS
  Filled 2023-12-30: qty 1

## 2023-12-30 MED ORDER — HYDROCODONE-ACETAMINOPHEN 5-325 MG PO TABS
1.0000 | ORAL_TABLET | Freq: Once | ORAL | Status: AC
  Administered 2023-12-30: 1 via ORAL
  Filled 2023-12-30: qty 1

## 2023-12-30 MED ORDER — GADOBUTROL 1 MMOL/ML IV SOLN
6.0000 mL | Freq: Once | INTRAVENOUS | Status: AC | PRN
  Administered 2023-12-30: 6 mL via INTRAVENOUS

## 2023-12-30 MED ORDER — DEXAMETHASONE 4 MG PO TABS: 4.0000 mg | ORAL_TABLET | Freq: Every day | ORAL | 0 refills | Status: AC

## 2023-12-30 NOTE — Progress Notes (Signed)
   12/30/23 1517  TOC Discharge Assessment  Final next level of care Assisted Living  Once discharged, how will the patient get to their discharge location? Family/Friend - [facility]  Barriers to Discharge No Barriers Identified  Patient states their goals for this hospitalization and ongoing recovery are: Patient daughter pallative care/hospice  [place] ownership interest in [facility].provided to: Adult Children  [facility]  Date [facility] Agency Contacted 12/30/23  Time [facility] Agency Contacted [phone]  Representative spoke with at [facility] Megan   CM spoke with patient daughter  Whitney Watts at bedside. CM provided information regarding palliative, and hospice care. Referral sent to [facility]).

## 2023-12-30 NOTE — ED Notes (Signed)
 Pt assisted to the bathroom via wheelchair. Linens on bed changed, new diaper placed, peri-care performed. Disposable pants and non-slip socks placed on pt. Daughter remains at bedside.

## 2023-12-30 NOTE — Progress Notes (Signed)
   12/30/23 1511  Discharge Planning  Type of Residence Assisted living  Care Facility Name [facility] Yes  Type of Home Care Services Hospice  Home Care Agency (if known) [facility]  Support Systems Children  Does the patient have any problems obtaining your medications? No  Does the patient have difficulty doing errands alone such as visiting a doctor's office or shopping? N  Family/patient expects to be discharged to: Assisted living  Once discharged, how will the patient get to their discharge location? Family/Friend - [facility]  Once discharged, how will the patient get to their follow-up appointment? Family/Friend - [facility]   Patient in ED for secondary to altered mental status.

## 2023-12-30 NOTE — ED Provider Notes (Signed)
 [place] EMERGENCY DEPARTMENT AT [facility] Provider Note   CSN: [id number] Arrival date & time: 12/30/23  4098     History  Chief Complaint  Patient presents with   Altered Mental Status    Whitney Watts is Watts 89 y.o. female, hx of DMII, who presents to the ED secondary to altered mental status.  She is [facility] from [facility] [place], and history is limited.  History obtained from RN at the facility, she states the confusion started yesterday, and has progressively gotten worse, until today.  States that Whitney Watts, is typically alert and oriented x 3, and very aware of her surroundings, and she starts seemed confused yesterday.  Notes that today she started saying that she has never been in the facilities dining room, and that she did not know where she was.  She has been taking her medications, has not had any fevers, chills, weight loss, or decline in her status, the last few months.  Notes that she has been very compliant, with all of her medical regimens, and was well until yesterday.  Has not had any recent falls  Home Medications Prior to Admission medications   Medication Sig Start Date End Date Taking? Authorizing Provider  dexamethasone (DECADRON) 4 MG tablet Take 1 tablet (4 mg total) by mouth daily. 12/30/23  Yes Whitney Soltys L, PA  acetaminophen  (TYLENOL ) 500 MG tablet Take 500 mg by mouth every 6 (six) hours as needed for mild pain.    [provider]  amLODipine  (NORVASC ) 10 MG tablet Take 10 mg by mouth daily.    [provider]  metFORMIN (GLUCOPHAGE-XR) 500 MG 24 hr tablet Take 1,000 mg by mouth 2 (two) times daily. 04/23/20   [provider]  pantoprazole  (PROTONIX ) 40 MG tablet Take 1 tablet (40 mg total) by mouth daily. 02/12/23   Whitney Limber A, PA-C  potassium chloride  SA (KLOR-CON  M) 20 MEQ tablet Take 1 tablet (20 mEq total) by mouth daily. 02/14/23   Whitney Montenegro, MD  traMADol  (ULTRAM ) 50 MG tablet TAKE ONE TABLET  BY MOUTH EVERY 6 HOURS AS NEEDED Patient taking differently: Take 50 mg by mouth every 6 (six) hours as needed for moderate pain (pain score 4-6). 11/10/21   Whitney Frater, MD      Allergies    Bee venom, Pravastatin sodium, and Rosuvastatin    Review of Systems   Review of Systems  Constitutional:  Negative for chills and fever.  Psychiatric/Behavioral:  Positive for confusion.     Physical Exam Updated Vital Signs BP 136/60 (BP Location: Left Arm)   Pulse 72   Temp 98.4 F (36.9 C) (Oral)   Resp 19   Ht 5\' 6"  (1.676 m)   Wt 64.9 kg   SpO2 97%   BMI 23.08 kg/m  Physical Exam Vitals and nursing note reviewed.  Constitutional:      General: She is not in acute distress.    Appearance: She is well-developed.     Comments: Alert oriented to self and place  HENT:     Head: Normocephalic and atraumatic.  Eyes:     Conjunctiva/sclera: Conjunctivae normal.  Cardiovascular:     Rate and Rhythm: Normal rate and regular rhythm.     Heart sounds: No murmur heard. Pulmonary:     Effort: Pulmonary effort is normal. No respiratory distress.     Breath sounds: Normal breath sounds.  Abdominal:     Palpations: Abdomen is soft.  Tenderness: There is no abdominal tenderness.  Musculoskeletal:        General: No swelling.     Cervical back: Neck supple.  Skin:    General: Skin is warm and dry.     Capillary Refill: Capillary refill takes less than 2 seconds.  Psychiatric:        Mood and Affect: Mood normal.     ED Results / Procedures / Treatments   Labs (all labs ordered are listed, but only abnormal results are displayed) Labs Reviewed  CBC WITH DIFFERENTIAL/PLATELET - Abnormal; Notable for the following components:      Result Value   RBC 3.47 (*)    Hemoglobin 10.6 (*)    HCT 32.9 (*)    All other components within normal limits  COMPREHENSIVE METABOLIC PANEL WITH GFR - Abnormal; Notable for the following components:   Glucose, Bld 181 (*)    BUN 31 (*)     Total Protein 5.9 (*)    Albumin 3.1 (*)    AST 13 (*)    GFR, Estimated 60 (*)    All other components within normal limits  CBG MONITORING, ED - Abnormal; Notable for the following components:   Glucose-Capillary 203 (*)    All other components within normal limits  URINALYSIS, ROUTINE W REFLEX MICROSCOPIC    EKG EKG Interpretation Date/Time:  Saturday December 30 2023 10:40:27 EDT Ventricular Rate:  76 PR Interval:  171 QRS Duration:  108 QT Interval:  422 QTC Calculation: 475 R Axis:   -52  Text Interpretation: Sinus rhythm Atrial premature complex Incomplete left bundle branch block Anterior Q waves, possibly due to ILBBB No acute changes No significant change since last tracing Confirmed by Whitney Watts [phone]) on 12/30/2023 11:05:54 AM  Radiology MR Brain W and Wo Contrast Result Date: 12/30/2023 CLINICAL DATA:  Altered mental status. EXAM: MRI HEAD WITHOUT AND WITH CONTRAST TECHNIQUE: Multiplanar, multiecho pulse sequences of the brain and surrounding structures were obtained without and with intravenous contrast. CONTRAST:  6mL GADAVIST GADOBUTROL 1 MMOL/ML IV SOLN COMPARISON:  Head CT 11/01/2023 and earlier today FINDINGS: Brain: T2 hyperintensity and thickening of the anterior and medial right temporal lobe with Whitney Watts cystic signal space in the anterior temporal lobe that relates to an area of necrotic type enhancement that is central to the area of swelling, enhancing area measuring nearly 4 cm in length on sagittal acquisition. Glioblastoma is favored given the constellation of findings. Same distribution can be affected by encephalitis, including herpes, but the finding was likely present in retrospect on 11/01/2023 head CT. Background of advanced chronic Whitney Watts vessel ischemia and mild cerebral atrophy. There is Watts dural based nodule superiorly on the left measuring 9 mm, compatible with incidental meningioma. Vascular: Major flow voids and vascular enhancements are preserved.  Skull and upper cervical spine: Normal marrow signal Sinuses/Orbits: Negative IMPRESSION: Confirmed swelling in the anterior right temporal lobe, strongly favor glioblastoma. The same area can be affected by herpes encephalitis, but this entity is unlikely given the above, but do recommend correlation for infectious symptoms. Electronically Signed   By: Ronnette Coke M.D.   On: 12/30/2023 12:54   CT Head Wo Contrast Result Date: 12/30/2023 CLINICAL DATA:  Mental status change with unknown cause. EXAM: CT HEAD WITHOUT CONTRAST TECHNIQUE: Contiguous axial images were obtained from the base of the skull through the vertex without intravenous contrast. RADIATION DOSE REDUCTION: This exam was performed according to the departmental dose-optimization program which includes automated exposure control, adjustment  of the mA and/or kV according to patient size and/or use of iterative reconstruction technique. COMPARISON:  11/01/2023 head CT FINDINGS: Brain: Fullness of the anterior right temporal lobe with lack of sulci and poor gray-white differentiation, evaluation hindered by the degree of streak artifact. The brain is generally atrophic with low-density in the cerebral white matter from extensive chronic Demetruis Depaul vessel ischemia. Chronic lacune in the left medial thalamus. No evidence of acute infarct, acute hemorrhage, hydrocephalus, or collection. Vascular: No hyperdense vessel or unexpected calcification. Skull: Normal. Negative for fracture or focal lesion. Sinuses/Orbits: No acute finding IMPRESSION: Vague fullness and indistinctness of the anterior right temporal lobe when compared to 2023. Suggest brain MRI to evaluate for mass. Advanced chronic Katherine Syme vessel disease. Electronically Signed   By: Ronnette Coke M.D.   On: 12/30/2023 10:24   DG Chest 2 View Result Date: 12/30/2023 CLINICAL DATA:  Altered mental status. EXAM: CHEST - 2 VIEW COMPARISON:  11/01/2023 FINDINGS: Stable cardiomediastinal contours.  Heart size is normal. Aortic atherosclerotic calcifications. Diffuse chronic interstitial coarsening is noted bilaterally. No pleural fluid, interstitial edema or airspace disease. Soft tissue artifact is noted overlying the left lung base. Visualized osseous structures appear grossly intact. IMPRESSION: 1. No acute cardiopulmonary abnormalities. 2. Chronic interstitial coarsening. 3. Aortic Atherosclerosis (ICD10-I70.0). Electronically Signed   By: Kimberley Penman M.D.   On: 12/30/2023 10:21    Procedures Procedures    Medications Ordered in ED Medications  gadobutrol (GADAVIST) 1 MMOL/ML injection 6 mL (6 mLs Intravenous Contrast Given 12/30/23 1243)  HYDROcodone-acetaminophen  (NORCO/VICODIN) 5-325 MG per tablet 1 tablet (1 tablet Oral Given 12/30/23 1319)  dexamethasone (DECADRON) injection 10 mg (10 mg Intravenous Given 12/30/23 1325)    ED Course/ Medical Decision Making/ Watts&P                                 Medical Decision Making Patient is an overall well-appearing 89 year old female, is alert and oriented to self, and place.  She does appear little bit confused, believes it is 1924.  I spoke with the RN, the facility, he states she is typically very with it.  Will obtain Watts head CT, chest x-ray, and blood work for further evaluation, and further evaluate  Amount and/or Complexity of Data Reviewed Labs: ordered.    Details: Blood work unremarkable Radiology: ordered.    Details: Chest x-ray clear for no acute findings, CT shows findings concerning for mass, MRI obtained, findings concerning for glioblastoma Discussion of management or test interpretation with external provider(s): Patient is Watts 89 year old female, here for altered mental status rule out stay, she has no acute findings, or neurofindings on exam, CT shows concerning findings for mass, MRI obtained shows concerning findings for glioblastoma, I discussed with her daughter at length, she plans on staying with the patient at the  assisted living, until things can be arranged.  I informed her of the glioblastoma, findings, and reached out to neuro-oncology, Dr. Mark Sil, who recommends starting Decadron 4 mg, for symptomatic control, so I would prescribe 2 weeks worth of this and will have him follow-up with Dr. Mark Sil.  Daughter would like to pursue hospice/palliative treatment, thus I consulted social work, who helped arrange, hospice/palliative care for further management.  We discussed the prognosis of this, being quite serious, and daughter voiced understanding, she is medical power of attorney, and will stay with patient, until arrangements have been made.  Decadron 2 weeks worth, sent, and  discussed emergent return precautions  Risk Prescription drug management.    Final Clinical Impression(s) / ED Diagnoses Final diagnoses:  Brain mass  Confusion    Rx / DC Orders ED Discharge Orders          Ordered    dexamethasone (DECADRON) 4 MG tablet  Daily        12/30/23 1518              Jceon Alverio, [place], [place] 12/30/23 1543    Whitney Face, MD 12/31/23 [phone]

## 2023-12-30 NOTE — ED Notes (Signed)
 [place] from [facility] [place] called for an updated on her status.

## 2023-12-30 NOTE — ED Notes (Signed)
 Pt transported to radiology.

## 2023-12-30 NOTE — ED Triage Notes (Signed)
 Patient brought by [facility] from [facility] [place] for altered mental status since 11am yesterday. The facility suspected a UTI and EMS reported a CBG over 200. Patient does not know if has history of diabetes.

## 2023-12-30 NOTE — Discharge Instructions (Addendum)
 Please follow-up with neuro-oncology, for further management, I prescribed you a course of Decadron, for at least 2 to weeks, to help with the swelling of the brain.  Please follow-up with your primary care doctor, and hospice as instructed.  Please have someone with the patient, at all times, as she has an increased fall risk, with a brain tumor.  Return to the ER, if symptoms worsen

## 2024-01-01 DIAGNOSIS — E119 Type 2 diabetes mellitus without complications: Secondary | ICD-10-CM | POA: Diagnosis not present

## 2024-01-01 DIAGNOSIS — C719 Malignant neoplasm of brain, unspecified: Secondary | ICD-10-CM | POA: Diagnosis not present

## 2024-01-03 DIAGNOSIS — E119 Type 2 diabetes mellitus without complications: Secondary | ICD-10-CM | POA: Diagnosis not present

## 2024-01-03 DIAGNOSIS — D509 Iron deficiency anemia, unspecified: Secondary | ICD-10-CM | POA: Diagnosis not present

## 2024-01-03 DIAGNOSIS — I10 Essential (primary) hypertension: Secondary | ICD-10-CM | POA: Diagnosis not present

## 2024-03-05 DEATH — deceased

## 2024-06-19 ENCOUNTER — Encounter (INDEPENDENT_AMBULATORY_CARE_PROVIDER_SITE_OTHER): Payer: Self-pay | Admitting: Gastroenterology
